# Patient Record
Sex: Female | Born: 1970
Health system: Southern US, Community
[De-identification: ages and names within clinical notes are randomized; demographics above are authoritative.]

## PROBLEM LIST (undated history)

## (undated) DIAGNOSIS — M1990 Unspecified osteoarthritis, unspecified site: Secondary | ICD-10-CM

## (undated) DIAGNOSIS — R112 Nausea with vomiting, unspecified: Secondary | ICD-10-CM

## (undated) DIAGNOSIS — T8859XA Other complications of anesthesia, initial encounter: Secondary | ICD-10-CM

## (undated) DIAGNOSIS — Z9889 Other specified postprocedural states: Secondary | ICD-10-CM

## (undated) DIAGNOSIS — K5100 Ulcerative (chronic) pancolitis without complications: Secondary | ICD-10-CM

## (undated) DIAGNOSIS — T4145XA Adverse effect of unspecified anesthetic, initial encounter: Secondary | ICD-10-CM

## (undated) DIAGNOSIS — I10 Essential (primary) hypertension: Secondary | ICD-10-CM

## (undated) DIAGNOSIS — K219 Gastro-esophageal reflux disease without esophagitis: Secondary | ICD-10-CM

## (undated) HISTORY — PX: CERVICAL FUSION: SHX112

## (undated) HISTORY — DX: Unspecified osteoarthritis, unspecified site: M19.90

## (undated) HISTORY — DX: Essential (primary) hypertension: I10

## (undated) HISTORY — PX: COLONOSCOPY: SHX174

## (undated) HISTORY — DX: Ulcerative (chronic) pancolitis without complications: K51.00

---
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
## 1998-01-20 ENCOUNTER — Encounter: Admission: RE | Admit: 1998-01-20 | Discharge: 1998-01-20 | Payer: Self-pay | Admitting: *Deleted

## 2000-06-03 ENCOUNTER — Other Ambulatory Visit: Admission: RE | Admit: 2000-06-03 | Discharge: 2000-06-03 | Payer: Self-pay | Admitting: Gastroenterology

## 2004-06-14 ENCOUNTER — Ambulatory Visit (HOSPITAL_COMMUNITY): Admission: RE | Admit: 2004-06-14 | Discharge: 2004-06-14 | Payer: Self-pay | Admitting: Gastroenterology

## 2005-03-21 ENCOUNTER — Other Ambulatory Visit: Admission: RE | Admit: 2005-03-21 | Discharge: 2005-03-21 | Payer: Self-pay | Admitting: Obstetrics & Gynecology

## 2011-09-19 ENCOUNTER — Other Ambulatory Visit: Payer: Self-pay | Admitting: Family Medicine

## 2011-09-19 DIAGNOSIS — Z1231 Encounter for screening mammogram for malignant neoplasm of breast: Secondary | ICD-10-CM

## 2011-10-01 ENCOUNTER — Ambulatory Visit
Admission: RE | Admit: 2011-10-01 | Discharge: 2011-10-01 | Disposition: A | Discharge status: Home / Self Care | Payer: BC Managed Care – PPO | Source: Ambulatory Visit | Attending: Family Medicine | Admitting: Family Medicine

## 2011-10-01 DIAGNOSIS — Z1231 Encounter for screening mammogram for malignant neoplasm of breast: Secondary | ICD-10-CM

## 2012-10-03 ENCOUNTER — Other Ambulatory Visit: Payer: Self-pay | Admitting: Gastroenterology

## 2012-10-06 ENCOUNTER — Ambulatory Visit
Admission: RE | Admit: 2012-10-06 | Discharge: 2012-10-06 | Disposition: A | Discharge status: Home / Self Care | Payer: BC Managed Care – PPO | Source: Ambulatory Visit | Attending: Gastroenterology | Admitting: Gastroenterology

## 2012-10-06 ENCOUNTER — Other Ambulatory Visit: Payer: Self-pay | Admitting: Gastroenterology

## 2012-10-06 DIAGNOSIS — K5150 Left sided colitis without complications: Secondary | ICD-10-CM

## 2013-07-14 DIAGNOSIS — K5100 Ulcerative (chronic) pancolitis without complications: Secondary | ICD-10-CM

## 2013-07-14 HISTORY — DX: Ulcerative (chronic) pancolitis without complications: K51.00

## 2014-05-14 ENCOUNTER — Emergency Department (HOSPITAL_COMMUNITY)
Admission: EM | Admit: 2014-05-14 | Discharge: 2014-05-14 | Discharge status: Against Medical Advice | Payer: BC Managed Care – PPO | Source: Home / Self Care

## 2014-05-14 ENCOUNTER — Encounter (HOSPITAL_COMMUNITY): Payer: Self-pay | Admitting: Emergency Medicine

## 2014-05-14 DIAGNOSIS — R0600 Dyspnea, unspecified: Secondary | ICD-10-CM

## 2014-05-14 DIAGNOSIS — R079 Chest pain, unspecified: Secondary | ICD-10-CM

## 2014-05-14 MED ORDER — ASPIRIN 81 MG PO CHEW
324.0000 mg | CHEWABLE_TABLET | Freq: Once | ORAL | Status: AC
  Administered 2014-05-14: 324 mg via ORAL

## 2014-05-14 MED ORDER — ASPIRIN 81 MG PO CHEW
CHEWABLE_TABLET | ORAL | Status: AC
  Filled 2014-05-14: qty 1

## 2014-05-14 MED ORDER — NITROGLYCERIN 0.4 MG SL SUBL
0.4000 mg | SUBLINGUAL_TABLET | SUBLINGUAL | Status: DC | PRN
  Administered 2014-05-14: 0.4 mg via SUBLINGUAL

## 2014-05-14 MED ORDER — SODIUM CHLORIDE 0.9 % IV SOLN: Freq: Once | INTRAVENOUS | Status: DC

## 2014-05-14 MED ORDER — NITROGLYCERIN 0.4 MG SL SUBL
SUBLINGUAL_TABLET | SUBLINGUAL | Status: AC
  Filled 2014-05-14: qty 1

## 2014-05-14 NOTE — ED Notes (Signed)
Patient c/o back pain radiating around to her rib cage onset 5 am. Patient reports she also has had high blood pressure readings this morning. Ranging in the 150s/109. Patient states she was previously on hypertensive medications. Due to exercise she was able to stop those medications. Patient denies cardiac hx but does have ulcerative colitis which she takes medication for. NAD. No SOB.

## 2014-05-14 NOTE — ED Provider Notes (Signed)
Medical screening examination/treatment/procedure(s) were performed by resident physician or non-physician practitioner and as supervising physician I was immediately available for consultation/collaboration.   Adriana Quinby DOUGLAS MD.   Tannis Burstein D Shakeel Disney, MD 05/14/14 1533 

## 2014-05-14 NOTE — ED Notes (Signed)
Patient refused IV and transfer to hospital. Explained to patient that risk include blood clot in the lung, heart attack and DEATH. Patient understands this and will schedule f/u with a cardiologist and see gastroenterologist as well.

## 2014-05-14 NOTE — ED Provider Notes (Signed)
CSN: [id number]     Arrival date & time 05/14/14  0825 History   First MD Initiated Contact with Patient 05/14/14 0845     Chief Complaint  Patient presents with  . Chest Pain   (Consider location/radiation/quality/duration/timing/severity/associated sxs/prior Treatment) HPI Comments: 43 year old female awoke this morning at 5 AM due to chest pain. She describes a squeezing around the posterior and bilateral aspect of her chest. She felt as though someone were grabbing her. She says there is also a dull ache in the anterior left chest and numbness in the left arm. 4. Sometime she had difficulty in breathing and "hard to catch my breath". She is continuing to have some chest symptoms. Although she had nausea for a few minutes this has abated. She denies known cardiovascular or pulmonary disease. She was a smoker in her 1720s. She walks 3 miles a day. Denies exertional symptoms.   History reviewed. No pertinent past medical history. History reviewed. No pertinent past surgical history. No family history on file. History  Substance Use Topics  . Smoking status: Never Smoker   . Smokeless tobacco: Never Used  . Alcohol Use: Yes   OB History   Grav Para Term Preterm Abortions TAB SAB Ect Mult Living                 Review of Systems  Constitutional: Positive for activity change. Negative for fever, diaphoresis, appetite change and fatigue.  HENT: Negative.   Respiratory: Positive for chest tightness and shortness of breath. Negative for cough, choking and wheezing.   Cardiovascular: Positive for chest pain. Negative for palpitations and leg swelling.  Gastrointestinal: Positive for nausea. Negative for vomiting and abdominal pain.  Genitourinary: Negative.   Skin: Negative.   Neurological: Negative.     Allergies  Review of patient's allergies indicates no known allergies.  Home Medications   Prior to Admission medications   Not on File   BP 132/64  Pulse 78  Temp(Src) 98.4  F (36.9 C) (Oral)  Resp 16  SpO2 97%  LMP 04/25/2014 Physical Exam  Nursing note and vitals reviewed. Constitutional: She is oriented to person, place, and time. She appears well-developed. No distress.  Eyes: Conjunctivae and EOM are normal. Pupils are equal, round, and reactive to light.  Neck: Normal range of motion. Neck supple.  Cardiovascular: Normal rate, regular rhythm, normal heart sounds and intact distal pulses.   No murmur heard. Pulmonary/Chest: Effort normal and breath sounds normal. No respiratory distress. She has no wheezes. She has no rales. She exhibits no tenderness.  Unable to reproduce chest or back pain with deep palpation.  Abdominal: Soft.  Mild tenderness to the epigastrium  Musculoskeletal: Normal range of motion. She exhibits no edema and no tenderness.  Neurological: She is alert and oriented to person, place, and time. She exhibits normal muscle tone.  Skin: Skin is warm and dry. No rash noted. She is not diaphoretic. No erythema.  Psychiatric: She has a normal mood and affect.    ED Course  Procedures (including critical care time) Labs Review Labs Reviewed - No data to display  Imaging Review No results found.   MDM   1. Chest pain at rest   2. Dyspnea     Transfer to ED chest pain center for evaluation of chest pain and dyspnea occurring abruptly early this morning.  Iv NS at 50 mils per hour  Oxygen at 2 L per nasal cannula Monitor Nitroglycerin 0.4 mg sublingual now Aspirin 324 mg  by mouth chew now  At the time of administration of oxygen and IV the patient decided to refuse transport to the emergency department. It was discussed in detail with her the possibility of having a pulmonary embolism or other blood clot, heart attack or cardiac ischemia or other unknown potentially lethal condition. She states she understands. She is also instructed to go to the emergency department for continuation of problems or worsening. Otherwise  follow-up with the PCP. She will sign an AMA.      Hayden Rasmussenavid Veneta Sliter, NP 05/14/14 [phone]

## 2014-05-18 ENCOUNTER — Other Ambulatory Visit: Payer: Self-pay | Admitting: Gastroenterology

## 2014-05-18 DIAGNOSIS — R1084 Generalized abdominal pain: Secondary | ICD-10-CM

## 2014-06-02 ENCOUNTER — Ambulatory Visit (HOSPITAL_COMMUNITY)
Admission: RE | Admit: 2014-06-02 | Discharge: 2014-06-02 | Disposition: A | Discharge status: Home / Self Care | Payer: BC Managed Care – PPO | Source: Ambulatory Visit | Attending: Gastroenterology | Admitting: Gastroenterology

## 2014-06-02 ENCOUNTER — Other Ambulatory Visit: Payer: Self-pay | Admitting: Gastroenterology

## 2014-06-02 ENCOUNTER — Encounter (HOSPITAL_COMMUNITY)
Admission: RE | Admit: 2014-06-02 | Discharge: 2014-06-02 | Disposition: A | Discharge status: Home / Self Care | Payer: BC Managed Care – PPO | Source: Ambulatory Visit | Attending: Gastroenterology | Admitting: Gastroenterology

## 2014-06-02 DIAGNOSIS — R1084 Generalized abdominal pain: Secondary | ICD-10-CM

## 2014-06-02 DIAGNOSIS — R109 Unspecified abdominal pain: Secondary | ICD-10-CM | POA: Insufficient documentation

## 2014-06-02 MED ORDER — TECHNETIUM TC 99M MEBROFENIN IV KIT
5.1000 | PACK | Freq: Once | INTRAVENOUS | Status: AC | PRN
  Administered 2014-06-02: 5 via INTRAVENOUS

## 2014-06-02 MED ORDER — SINCALIDE 5 MCG IJ SOLR
0.0200 ug/kg | Freq: Once | INTRAMUSCULAR | Status: AC
  Administered 2014-06-02: 1.8 ug via INTRAVENOUS

## 2015-12-05 DIAGNOSIS — K51911 Ulcerative colitis, unspecified with rectal bleeding: Secondary | ICD-10-CM | POA: Diagnosis not present

## 2015-12-05 DIAGNOSIS — E669 Obesity, unspecified: Secondary | ICD-10-CM | POA: Diagnosis not present

## 2015-12-05 DIAGNOSIS — M2550 Pain in unspecified joint: Secondary | ICD-10-CM | POA: Diagnosis not present

## 2015-12-06 DIAGNOSIS — K51911 Ulcerative colitis, unspecified with rectal bleeding: Secondary | ICD-10-CM | POA: Diagnosis not present

## 2015-12-06 DIAGNOSIS — E669 Obesity, unspecified: Secondary | ICD-10-CM | POA: Diagnosis not present

## 2015-12-06 DIAGNOSIS — M2550 Pain in unspecified joint: Secondary | ICD-10-CM | POA: Diagnosis not present

## 2016-01-13 ENCOUNTER — Other Ambulatory Visit: Payer: Self-pay | Admitting: Family Medicine

## 2016-01-13 DIAGNOSIS — Z0000 Encounter for general adult medical examination without abnormal findings: Secondary | ICD-10-CM | POA: Diagnosis not present

## 2016-01-13 DIAGNOSIS — K5100 Ulcerative (chronic) pancolitis without complications: Secondary | ICD-10-CM | POA: Diagnosis not present

## 2016-01-13 DIAGNOSIS — Z1231 Encounter for screening mammogram for malignant neoplasm of breast: Secondary | ICD-10-CM

## 2016-01-13 DIAGNOSIS — Z1151 Encounter for screening for human papillomavirus (HPV): Secondary | ICD-10-CM | POA: Diagnosis not present

## 2016-01-13 DIAGNOSIS — I10 Essential (primary) hypertension: Secondary | ICD-10-CM | POA: Diagnosis not present

## 2016-01-13 DIAGNOSIS — Z124 Encounter for screening for malignant neoplasm of cervix: Secondary | ICD-10-CM | POA: Diagnosis not present

## 2016-01-13 LAB — HM PAP SMEAR: HM Pap smear: NEGATIVE

## 2016-01-24 ENCOUNTER — Other Ambulatory Visit: Payer: Self-pay | Admitting: Family Medicine

## 2016-01-24 ENCOUNTER — Ambulatory Visit
Admission: RE | Admit: 2016-01-24 | Discharge: 2016-01-24 | Disposition: A | Discharge status: Home / Self Care | Payer: BLUE CROSS/BLUE SHIELD | Source: Ambulatory Visit | Attending: Family Medicine | Admitting: Family Medicine

## 2016-01-24 DIAGNOSIS — Z1231 Encounter for screening mammogram for malignant neoplasm of breast: Secondary | ICD-10-CM

## 2016-01-26 ENCOUNTER — Other Ambulatory Visit: Payer: Self-pay | Admitting: Family Medicine

## 2016-01-26 DIAGNOSIS — R928 Other abnormal and inconclusive findings on diagnostic imaging of breast: Secondary | ICD-10-CM

## 2016-01-31 ENCOUNTER — Ambulatory Visit
Admission: RE | Admit: 2016-01-31 | Discharge: 2016-01-31 | Disposition: A | Discharge status: Home / Self Care | Payer: BLUE CROSS/BLUE SHIELD | Source: Ambulatory Visit | Attending: Family Medicine | Admitting: Family Medicine

## 2016-01-31 DIAGNOSIS — A039 Shigellosis, unspecified: Secondary | ICD-10-CM | POA: Diagnosis not present

## 2016-01-31 DIAGNOSIS — R195 Other fecal abnormalities: Secondary | ICD-10-CM | POA: Diagnosis not present

## 2016-01-31 DIAGNOSIS — N6489 Other specified disorders of breast: Secondary | ICD-10-CM | POA: Diagnosis not present

## 2016-01-31 DIAGNOSIS — R194 Change in bowel habit: Secondary | ICD-10-CM | POA: Diagnosis not present

## 2016-01-31 DIAGNOSIS — R928 Other abnormal and inconclusive findings on diagnostic imaging of breast: Secondary | ICD-10-CM | POA: Diagnosis not present

## 2016-01-31 DIAGNOSIS — K625 Hemorrhage of anus and rectum: Secondary | ICD-10-CM | POA: Diagnosis not present

## 2016-01-31 DIAGNOSIS — R109 Unspecified abdominal pain: Secondary | ICD-10-CM | POA: Diagnosis not present

## 2016-01-31 DIAGNOSIS — Z0000 Encounter for general adult medical examination without abnormal findings: Secondary | ICD-10-CM | POA: Diagnosis not present

## 2016-02-01 ENCOUNTER — Other Ambulatory Visit: Payer: BLUE CROSS/BLUE SHIELD

## 2016-03-02 DIAGNOSIS — I10 Essential (primary) hypertension: Secondary | ICD-10-CM | POA: Diagnosis not present

## 2016-03-02 DIAGNOSIS — R7989 Other specified abnormal findings of blood chemistry: Secondary | ICD-10-CM | POA: Diagnosis not present

## 2016-03-02 DIAGNOSIS — R3129 Other microscopic hematuria: Secondary | ICD-10-CM | POA: Diagnosis not present

## 2016-06-26 DIAGNOSIS — Z23 Encounter for immunization: Secondary | ICD-10-CM | POA: Diagnosis not present

## 2016-07-25 DIAGNOSIS — I10 Essential (primary) hypertension: Secondary | ICD-10-CM | POA: Diagnosis not present

## 2016-07-25 DIAGNOSIS — J40 Bronchitis, not specified as acute or chronic: Secondary | ICD-10-CM | POA: Diagnosis not present

## 2016-07-25 DIAGNOSIS — R05 Cough: Secondary | ICD-10-CM | POA: Diagnosis not present

## 2016-07-25 DIAGNOSIS — J0100 Acute maxillary sinusitis, unspecified: Secondary | ICD-10-CM | POA: Diagnosis not present

## 2017-03-11 ENCOUNTER — Other Ambulatory Visit: Payer: Self-pay | Admitting: Gastroenterology

## 2017-03-11 DIAGNOSIS — Z1231 Encounter for screening mammogram for malignant neoplasm of breast: Secondary | ICD-10-CM

## 2017-03-25 ENCOUNTER — Ambulatory Visit: Payer: BLUE CROSS/BLUE SHIELD

## 2017-03-28 ENCOUNTER — Ambulatory Visit
Admission: RE | Admit: 2017-03-28 | Discharge: 2017-03-28 | Disposition: A | Discharge status: Home / Self Care | Payer: BLUE CROSS/BLUE SHIELD | Source: Ambulatory Visit | Attending: Gastroenterology | Admitting: Gastroenterology

## 2017-03-28 DIAGNOSIS — Z1231 Encounter for screening mammogram for malignant neoplasm of breast: Secondary | ICD-10-CM | POA: Diagnosis not present

## 2017-05-28 DIAGNOSIS — Z23 Encounter for immunization: Secondary | ICD-10-CM | POA: Diagnosis not present

## 2017-06-15 DIAGNOSIS — J019 Acute sinusitis, unspecified: Secondary | ICD-10-CM | POA: Diagnosis not present

## 2017-06-15 DIAGNOSIS — I10 Essential (primary) hypertension: Secondary | ICD-10-CM | POA: Diagnosis not present

## 2017-06-15 DIAGNOSIS — B9689 Other specified bacterial agents as the cause of diseases classified elsewhere: Secondary | ICD-10-CM | POA: Diagnosis not present

## 2017-06-15 DIAGNOSIS — H109 Unspecified conjunctivitis: Secondary | ICD-10-CM | POA: Diagnosis not present

## 2017-06-15 DIAGNOSIS — J209 Acute bronchitis, unspecified: Secondary | ICD-10-CM | POA: Diagnosis not present

## 2017-06-28 ENCOUNTER — Ambulatory Visit: Payer: BLUE CROSS/BLUE SHIELD | Admitting: Family Medicine

## 2017-07-23 ENCOUNTER — Encounter: Payer: Self-pay | Admitting: Family Medicine

## 2017-07-23 ENCOUNTER — Encounter: Payer: Self-pay | Admitting: *Deleted

## 2017-07-26 ENCOUNTER — Ambulatory Visit: Payer: BLUE CROSS/BLUE SHIELD | Admitting: Family Medicine

## 2017-08-16 ENCOUNTER — Ambulatory Visit: Payer: BLUE CROSS/BLUE SHIELD | Admitting: Family Medicine

## 2017-08-20 DIAGNOSIS — M542 Cervicalgia: Secondary | ICD-10-CM | POA: Diagnosis not present

## 2017-08-20 DIAGNOSIS — R202 Paresthesia of skin: Secondary | ICD-10-CM | POA: Diagnosis not present

## 2017-08-22 ENCOUNTER — Other Ambulatory Visit: Payer: Self-pay | Admitting: Neurosurgery

## 2017-08-22 DIAGNOSIS — R202 Paresthesia of skin: Secondary | ICD-10-CM

## 2017-08-27 ENCOUNTER — Ambulatory Visit
Admission: RE | Admit: 2017-08-27 | Discharge: 2017-08-27 | Disposition: A | Discharge status: Home / Self Care | Payer: BLUE CROSS/BLUE SHIELD | Source: Ambulatory Visit | Attending: Neurosurgery | Admitting: Neurosurgery

## 2017-08-27 DIAGNOSIS — M4802 Spinal stenosis, cervical region: Secondary | ICD-10-CM | POA: Diagnosis not present

## 2017-08-27 DIAGNOSIS — R202 Paresthesia of skin: Secondary | ICD-10-CM

## 2017-08-30 DIAGNOSIS — Z6833 Body mass index (BMI) 33.0-33.9, adult: Secondary | ICD-10-CM | POA: Diagnosis not present

## 2017-08-30 DIAGNOSIS — M4802 Spinal stenosis, cervical region: Secondary | ICD-10-CM | POA: Diagnosis not present

## 2017-08-30 DIAGNOSIS — I10 Essential (primary) hypertension: Secondary | ICD-10-CM | POA: Diagnosis not present

## 2017-09-06 ENCOUNTER — Ambulatory Visit: Payer: BLUE CROSS/BLUE SHIELD | Admitting: Family Medicine

## 2017-09-06 ENCOUNTER — Other Ambulatory Visit: Payer: Self-pay

## 2017-09-06 ENCOUNTER — Encounter: Payer: Self-pay | Admitting: Family Medicine

## 2017-09-06 VITALS — BP 152/110 | HR 79 | Temp 99.7°F | Wt 207.2 lb

## 2017-09-06 DIAGNOSIS — M50122 Cervical disc disorder at C5-C6 level with radiculopathy: Secondary | ICD-10-CM | POA: Diagnosis not present

## 2017-09-06 DIAGNOSIS — I10 Essential (primary) hypertension: Secondary | ICD-10-CM

## 2017-09-06 DIAGNOSIS — R9431 Abnormal electrocardiogram [ECG] [EKG]: Secondary | ICD-10-CM | POA: Diagnosis not present

## 2017-09-06 MED ORDER — LISINOPRIL 10 MG PO TABS: 10.0000 mg | ORAL_TABLET | Freq: Every day | ORAL | 3 refills | Status: DC

## 2017-09-06 NOTE — Progress Notes (Signed)
Subjective  CC:  Chief Complaint  Patient presents with  . Establish Care    Transfer from [place]     HPI: Jillian Lopez is a 47 y.o. female who presents to the office today to address the problems listed above in the chief complaint. She is a former [facility] patient and is here to reestablish care with me today.    Hypertension f/u: Control is poor since being off meds x 2 weeks. she ran out. bp was very high at recent preop visit.. Prior to running out, she reports bp was good. Pt reports she is doing well. no medication side effects noted, no TIAs, no chest pain on exertion, no dyspnea on exertion, no swelling of ankles. She denies adverse effects from his BP medications. Compliance with medication was good before running out. She has been on prednisone due to neck radiculopathy. BP initially 152/120 today.   Cervical radiculopathy for lumbar fusion and discectomy next week: awoke 2 weeks ago with numbness in legs and arms. No weakness or neck pain. Evaluated by Dr. Wynetta Emery and MRI revealed C5-6 compressed disc with spurring impinging spinal cord. Treated with steroid and having surgery next week.   UC - doing well off meds now.   HM: last cpe in 2017. Pap up to date. mammo up to date. Due for labs.   BP Readings from Last 3 Encounters:  09/06/17 (!) 152/110  05/14/14 132/64   Wt Readings from Last 3 Encounters:  09/06/17 207 lb 3.2 oz (94 kg)  06/02/14 202 lb (91.6 kg)    I reviewed the patients updated PMH, FH, and SocHx.    Patient Active Problem List   Diagnosis Date Noted  . Cervical disc disorder at C5-C6 level with radiculopathy 09/06/2017  . Essential hypertension 03/02/2016  . Ulcerative pancolitis without complication (HCC) 07/14/2013    Allergies: Patient has no known allergies.  Social History: Patient  reports that she quit smoking about 19 years ago. Her smoking use included cigarettes. She has a 1.50 pack-year smoking history. she has never used smokeless  tobacco. She reports that she drinks alcohol. She reports that she does not use drugs.  Current Meds  Medication Sig  . methylPREDNISolone (MEDROL DOSEPAK) 4 MG TBPK tablet See admin instructions. see package    Review of Systems: Cardiovascular: negative for chest pain, palpitations, leg swelling, orthopnea Respiratory: negative for SOB, wheezing or persistent cough Gastrointestinal: negative for abdominal pain Genitourinary: negative for dysuria or gross hematuria Neuro: + numbness in bilateral upper extremities  Objective  Vitals: BP (!) 152/110 Comment: recheked in supine position by A. Peterman  Pulse 79   Temp 99.7 F (37.6 C)   Wt 207 lb 3.2 oz (94 kg)  General: no acute distress  Psych:  Alert and oriented, normal mood and affect HEENT:  Normocephalic, atraumatic, supple neck  Cardiovascular:  RRR without murmur. no edema Respiratory:  Good breath sounds bilaterally, CTAB with normal respiratory effort Skin:  Warm, no rashes Neurologic:   Mental status is normal EKG: compared to 2015; NSR, rate 65, Q waves in V1and V2 Assessment  1. Essential hypertension   2. Cervical disc disorder at C5-C6 level with radiculopathy   3. Abnormal EKG      Plan    Hypertension f/u: BP control is poorly controlled. Due to running out of medication. Restart ace inhibitor and recheck bps in office over next week prior to surgery. Adjust meds quickly to get bp controlled prior to surgery. Check  labs today.  Cervical radiculopathy f/u: stable w/o weakness. Needs surgery. Needs clearance first. Can't proceed with uncontrolled bp and need to clarify CVD risks.   Abnormal EKG: possible old anteroseptal infarct. ekg changes are new. No acute changes. May warrant stress testing/echocardiogram.  Education regarding management of these chronic disease states was given. Management strategies discussed on successive visits include dietary and exercise recommendations, goals of achieving and  maintaining IBW, and lifestyle modifications aiming for adequate sleep and minimizing stressors.   Follow up: f/u early next week to recheck blood pressure.   Commons side effects, risks, benefits, and alternatives for medications and treatment plan prescribed today were discussed, and the patient expressed understanding of the given instructions. Patient is instructed to call or message via MyChart if he/she has any questions or concerns regarding our treatment plan. No barriers to understanding were identified. We discussed Red Flag symptoms and signs in detail. Patient expressed understanding regarding what to do in case of urgent or emergency type symptoms.   Medication list was reconciled, printed and provided to the patient in AVS. Patient instructions and summary information was reviewed with the patient as documented in the AVS. This note was prepared with assistance of Dragon voice recognition software. Occasional wrong-word or sound-a-like substitutions may have occurred due to the inherent limitations of voice recognition software  Orders Placed This Encounter  Procedures  . CBC with Differential/Platelet  . Comprehensive metabolic panel  . TSH  . Lipid panel  . EKG 12-Lead   Meds ordered this encounter  Medications  . lisinopril (PRINIVIL,ZESTRIL) 10 MG tablet    Sig: Take 1 tablet (10 mg total) by mouth daily.    Dispense:  90 tablet    Refill:  3

## 2017-09-06 NOTE — Patient Instructions (Signed)
Please return in 3 months for blood pressure follow up.   It was a pleasure meeting you today! Thank you for choosing [place] to meet your healthcare needs! I truly look forward to working with you. If you have any questions or concerns, please send me a message via Mychart or call the office at [phone].  Hypertension Hypertension, commonly called high blood pressure, is when the force of blood pumping through the arteries is too strong. The arteries are the blood vessels that carry blood from the heart throughout the body. Hypertension forces the heart to work harder to pump blood and may cause arteries to become narrow or stiff. Having untreated or uncontrolled hypertension can cause heart attacks, strokes, kidney disease, and other problems. A blood pressure reading consists of a higher number over a lower number. Ideally, your blood pressure should be below 120/80. The first ("top") number is called the systolic pressure. It is a measure of the pressure in your arteries as your heart beats. The second ("bottom") number is called the diastolic pressure. It is a measure of the pressure in your arteries as the heart relaxes. What are the causes? The cause of this condition is not known. What increases the risk? Some risk factors for high blood pressure are under your control. Others are not. Factors you can change  Smoking.  Having type 2 diabetes mellitus, high cholesterol, or both.  Not getting enough exercise or physical activity.  Being overweight.  Having too much fat, sugar, calories, or salt (sodium) in your diet.  Drinking too much alcohol. Factors that are difficult or impossible to change  Having chronic kidney disease.  Having a family history of high blood pressure.  Age. Risk increases with age.  Race. You may be at higher risk if you are African-American.  Gender. Men are at higher risk than women before age 20. After age 22, women are at higher risk than  men.  Having obstructive sleep apnea.  Stress. What are the signs or symptoms? Extremely high blood pressure (hypertensive crisis) may cause:  Headache.  Anxiety.  Shortness of breath.  Nosebleed.  Nausea and vomiting.  Severe chest pain.  Jerky movements you cannot control (seizures).  How is this diagnosed? This condition is diagnosed by measuring your blood pressure while you are seated, with your arm resting on a surface. The cuff of the blood pressure monitor will be placed directly against the skin of your upper arm at the level of your heart. It should be measured at least twice using the same arm. Certain conditions can cause a difference in blood pressure between your right and left arms. Certain factors can cause blood pressure readings to be lower or higher than normal (elevated) for a short period of time:  When your blood pressure is higher when you are in a health care provider's office than when you are at home, this is called white coat hypertension. Most people with this condition do not need medicines.  When your blood pressure is higher at home than when you are in a health care provider's office, this is called masked hypertension. Most people with this condition may need medicines to control blood pressure.  If you have a high blood pressure reading during one visit or you have normal blood pressure with other risk factors:  You may be asked to return on a different day to have your blood pressure checked again.  You may be asked to monitor your blood pressure at home for  1 week or longer.  If you are diagnosed with hypertension, you may have other blood or imaging tests to help your health care provider understand your overall risk for other conditions. How is this treated? This condition is treated by making healthy lifestyle changes, such as eating healthy foods, exercising more, and reducing your alcohol intake. Your health care provider may prescribe  medicine if lifestyle changes are not enough to get your blood pressure under control, and if:  Your systolic blood pressure is above 130.  Your diastolic blood pressure is above 80.  Your personal target blood pressure may vary depending on your medical conditions, your age, and other factors. Follow these instructions at home: Eating and drinking  Eat a diet that is high in fiber and potassium, and low in sodium, added sugar, and fat. An example eating plan is called the DASH (Dietary Approaches to Stop Hypertension) diet. To eat this way: ? Eat plenty of fresh fruits and vegetables. Try to fill half of your plate at each meal with fruits and vegetables. ? Eat whole grains, such as whole wheat pasta, brown rice, or whole grain bread. Fill about one quarter of your plate with whole grains. ? Eat or drink low-fat dairy products, such as skim milk or low-fat yogurt. ? Avoid fatty cuts of meat, processed or cured meats, and poultry with skin. Fill about one quarter of your plate with lean proteins, such as fish, chicken without skin, beans, eggs, and tofu. ? Avoid premade and processed foods. These tend to be higher in sodium, added sugar, and fat.  Reduce your daily sodium intake. Most people with hypertension should eat less than 1,500 mg of sodium a day.  Limit alcohol intake to no more than 1 drink a day for nonpregnant women and 2 drinks a day for men. One drink equals 12 oz of beer, 5 oz of wine, or 1 oz of hard liquor. Lifestyle  Work with your health care provider to maintain a healthy body weight or to lose weight. Ask what an ideal weight is for you.  Get at least 30 minutes of exercise that causes your heart to beat faster (aerobic exercise) most days of the week. Activities may include walking, swimming, or biking.  Include exercise to strengthen your muscles (resistance exercise), such as pilates or lifting weights, as part of your weekly exercise routine. Try to do these types  of exercises for 30 minutes at least 3 days a week.  Do not use any products that contain nicotine or tobacco, such as cigarettes and e-cigarettes. If you need help quitting, ask your health care provider.  Monitor your blood pressure at home as told by your health care provider.  Keep all follow-up visits as told by your health care provider. This is important. Medicines  Take over-the-counter and prescription medicines only as told by your health care provider. Follow directions carefully. Blood pressure medicines must be taken as prescribed.  Do not skip doses of blood pressure medicine. Doing this puts you at risk for problems and can make the medicine less effective.  Ask your health care provider about side effects or reactions to medicines that you should watch for. Contact a health care provider if:  You think you are having a reaction to a medicine you are taking.  You have headaches that keep coming back (recurring).  You feel dizzy.  You have swelling in your ankles.  You have trouble with your vision. Get help right away if:  You develop a severe headache or confusion.  You have unusual weakness or numbness.  You feel faint.  You have severe pain in your chest or abdomen.  You vomit repeatedly.  You have trouble breathing. Summary  Hypertension is when the force of blood pumping through your arteries is too strong. If this condition is not controlled, it may put you at risk for serious complications.  Your personal target blood pressure may vary depending on your medical conditions, your age, and other factors. For most people, a normal blood pressure is less than 120/80.  Hypertension is treated with lifestyle changes, medicines, or a combination of both. Lifestyle changes include weight loss, eating a healthy, low-sodium diet, exercising more, and limiting alcohol. This information is not intended to replace advice given to you by your health care provider.  Make sure you discuss any questions you have with your health care provider. Document Released: 07/02/2005 Document Revised: 05/30/2016 Document Reviewed: 05/30/2016 Elsevier Interactive Patient Education  [employer].

## 2017-09-09 ENCOUNTER — Telehealth: Payer: Self-pay | Admitting: Family Medicine

## 2017-09-09 ENCOUNTER — Encounter: Payer: Self-pay | Admitting: Family Medicine

## 2017-09-09 NOTE — Telephone Encounter (Signed)
Spoke with patient, will repeat EKG and labs at next visit. Patient verbalized understanding. AP, LPN

## 2017-09-09 NOTE — Telephone Encounter (Signed)
EKG - I believe it is ok but needs to be repeated at next visit. No further evaluation needed prior to surgery. Will need labwork and repeat ekg with me at her next visit. Blood pressure is not controlled. Continue meds.

## 2017-09-09 NOTE — Telephone Encounter (Signed)
Patient is going to [facility] for other labs, wants to know can she do labs at other visit. Please advise

## 2017-09-09 NOTE — Telephone Encounter (Signed)
Copied from [place]. Topic: Quick Communication - See Telephone Encounter >> Sep 09, 2017  9:00 AM Robina Ade, Helene Kelp D wrote: CRM for notification. See Telephone encounter for: 09/09/17. Patient called and would like a call back regarding her labs and EKG she had done. She wants to know if everything is ok for her surgery. Please call patient back, thanks.

## 2017-09-10 DIAGNOSIS — M4802 Spinal stenosis, cervical region: Secondary | ICD-10-CM | POA: Diagnosis not present

## 2017-09-10 DIAGNOSIS — Z01812 Encounter for preprocedural laboratory examination: Secondary | ICD-10-CM | POA: Diagnosis not present

## 2017-09-11 DIAGNOSIS — M4802 Spinal stenosis, cervical region: Secondary | ICD-10-CM | POA: Diagnosis not present

## 2017-09-13 DIAGNOSIS — M4712 Other spondylosis with myelopathy, cervical region: Secondary | ICD-10-CM | POA: Diagnosis not present

## 2017-09-13 DIAGNOSIS — M50022 Cervical disc disorder at C5-C6 level with myelopathy: Secondary | ICD-10-CM | POA: Diagnosis not present

## 2017-09-13 DIAGNOSIS — M5020 Other cervical disc displacement, unspecified cervical region: Secondary | ICD-10-CM | POA: Diagnosis not present

## 2017-10-17 DIAGNOSIS — M542 Cervicalgia: Secondary | ICD-10-CM | POA: Diagnosis not present

## 2017-11-21 DIAGNOSIS — M542 Cervicalgia: Secondary | ICD-10-CM | POA: Diagnosis not present

## 2017-12-06 ENCOUNTER — Ambulatory Visit: Payer: BLUE CROSS/BLUE SHIELD | Admitting: Family Medicine

## 2018-01-24 ENCOUNTER — Ambulatory Visit: Payer: BLUE CROSS/BLUE SHIELD | Admitting: Family Medicine

## 2018-02-27 ENCOUNTER — Telehealth: Payer: Self-pay | Admitting: Family Medicine

## 2018-02-27 MED ORDER — PREDNISONE 10 MG PO TABS: ORAL_TABLET | ORAL | 0 refills | Status: DC

## 2018-02-27 NOTE — Telephone Encounter (Signed)
Pt called: stress at work; having a uc flare with diarrhea and cramping. Requests pred taper.  Has GI doc and is scheduling an appointment.   Discussed overdue for BP f/u and cpe.  Pt reports will schedule an appt.   meds ordered.  Discussed red flag sxs. None present.

## 2018-03-20 DIAGNOSIS — Z1211 Encounter for screening for malignant neoplasm of colon: Secondary | ICD-10-CM | POA: Diagnosis not present

## 2018-03-20 DIAGNOSIS — K5190 Ulcerative colitis, unspecified, without complications: Secondary | ICD-10-CM | POA: Diagnosis not present

## 2018-03-20 DIAGNOSIS — K120 Recurrent oral aphthae: Secondary | ICD-10-CM | POA: Diagnosis not present

## 2018-04-11 ENCOUNTER — Ambulatory Visit (INDEPENDENT_AMBULATORY_CARE_PROVIDER_SITE_OTHER): Payer: BLUE CROSS/BLUE SHIELD | Admitting: Family Medicine

## 2018-04-11 ENCOUNTER — Other Ambulatory Visit: Payer: Self-pay

## 2018-04-11 ENCOUNTER — Encounter: Payer: Self-pay | Admitting: Family Medicine

## 2018-04-11 VITALS — BP 138/80 | HR 79 | Temp 98.7°F | Ht 67.0 in | Wt 202.6 lb

## 2018-04-11 DIAGNOSIS — Z23 Encounter for immunization: Secondary | ICD-10-CM | POA: Diagnosis not present

## 2018-04-11 DIAGNOSIS — R7989 Other specified abnormal findings of blood chemistry: Secondary | ICD-10-CM | POA: Diagnosis not present

## 2018-04-11 DIAGNOSIS — N951 Menopausal and female climacteric states: Secondary | ICD-10-CM | POA: Diagnosis not present

## 2018-04-11 DIAGNOSIS — I10 Essential (primary) hypertension: Secondary | ICD-10-CM | POA: Diagnosis not present

## 2018-04-11 DIAGNOSIS — H60543 Acute eczematoid otitis externa, bilateral: Secondary | ICD-10-CM | POA: Diagnosis not present

## 2018-04-11 LAB — CBC WITH DIFFERENTIAL/PLATELET
BASOS PCT: 0.5 % (ref 0.0–3.0)
Basophils Absolute: 0 10*3/uL (ref 0.0–0.1)
Eosinophils Absolute: 0.2 10*3/uL (ref 0.0–0.7)
Eosinophils Relative: 1.9 % (ref 0.0–5.0)
HCT: 42.1 % (ref 36.0–46.0)
HEMOGLOBIN: 14.3 g/dL (ref 12.0–15.0)
Lymphocytes Relative: 27.6 % (ref 12.0–46.0)
Lymphs Abs: 2.3 10*3/uL (ref 0.7–4.0)
MCHC: 33.9 g/dL (ref 30.0–36.0)
MCV: 95.3 fl (ref 78.0–100.0)
Monocytes Absolute: 0.4 10*3/uL (ref 0.1–1.0)
Monocytes Relative: 4.8 % (ref 3.0–12.0)
Neutro Abs: 5.5 10*3/uL (ref 1.4–7.7)
Neutrophils Relative %: 65.2 % (ref 43.0–77.0)
Platelets: 295 10*3/uL (ref 150.0–400.0)
RBC: 4.42 Mil/uL (ref 3.87–5.11)
RDW: 13.8 % (ref 11.5–15.5)
WBC: 8.4 10*3/uL (ref 4.0–10.5)

## 2018-04-11 LAB — COMPREHENSIVE METABOLIC PANEL
ALK PHOS: 42 U/L (ref 39–117)
ALT: 22 U/L (ref 0–35)
AST: 15 U/L (ref 0–37)
Albumin: 4.5 g/dL (ref 3.5–5.2)
BUN: 16 mg/dL (ref 6–23)
CALCIUM: 9.5 mg/dL (ref 8.4–10.5)
CHLORIDE: 104 meq/L (ref 96–112)
CO2: 24 mEq/L (ref 19–32)
CREATININE: 0.85 mg/dL (ref 0.40–1.20)
GFR: 75.59 mL/min (ref >60.00)
Glucose, Bld: 81 mg/dL (ref 70–99)
Potassium: 4.3 mEq/L (ref 3.5–5.1)
Sodium: 135 mEq/L (ref 135–145)
Total Bilirubin: 0.7 mg/dL (ref 0.2–1.2)
Total Protein: 7.5 g/dL (ref 6.0–8.3)

## 2018-04-11 LAB — LIPID PANEL
Cholesterol: 182 mg/dL (ref 0–200)
HDL: 37.4 mg/dL — ABNORMAL LOW (ref >39.00)
LDL Cholesterol: 115 mg/dL — ABNORMAL HIGH (ref 0–99)
NonHDL: 144.81
TRIGLYCERIDES: 147 mg/dL (ref 0.0–149.0)
Total CHOL/HDL Ratio: 5
VLDL: 29.4 mg/dL (ref 0.0–40.0)

## 2018-04-11 LAB — T4, FREE: Free T4: 0.84 ng/dL (ref 0.60–1.60)

## 2018-04-11 LAB — TSH: TSH: 2.65 u[IU]/mL (ref 0.35–4.50)

## 2018-04-11 MED ORDER — TRIAMCINOLONE ACETONIDE 0.1 % EX CREA: 1.0000 "application " | TOPICAL_CREAM | Freq: Two times a day (BID) | CUTANEOUS | 0 refills | Status: DC

## 2018-04-11 NOTE — Patient Instructions (Signed)
Please return in 6 months for your annual complete physical; please come fasting.    If you have any questions or concerns, please don't hesitate to send me a message via MyChart or call the office at [phone]. Thank you for visiting with [place] today! It's our pleasure caring for you.  Perimenopause Perimenopause is the time when your body begins to move into the menopause (no menstrual period for 12 straight months). It is a natural process. Perimenopause can begin 2-8 years before the menopause and usually lasts for 1 year after the menopause. During this time, your ovaries may or may not produce an egg. The ovaries vary in their production of estrogen and progesterone hormones each month. This can cause irregular menstrual periods, difficulty getting pregnant, vaginal bleeding between periods, and uncomfortable symptoms. What are the causes?  Irregular production of the ovarian hormones, estrogen and progesterone, and not ovulating every month. Other causes include:  Tumor of the pituitary gland in the brain.  Medical disease that affects the ovaries.  Radiation treatment.  Chemotherapy.  Unknown causes.  Heavy smoking and excessive alcohol intake can bring on perimenopause sooner.  What are the signs or symptoms?  Hot flashes.  Night sweats.  Irregular menstrual periods.  Decreased sex drive.  Vaginal dryness.  Headaches.  Mood swings.  Depression.  Memory problems.  Irritability.  Tiredness.  Weight gain.  Trouble getting pregnant.  The beginning of losing bone cells (osteoporosis).  The beginning of hardening of the arteries (atherosclerosis). How is this diagnosed? Your health care provider will make a diagnosis by analyzing your age, menstrual history, and symptoms. He or she will do a physical exam and note any changes in your body, especially your female organs. Female hormone tests may or may not be helpful depending on the amount of female  hormones you produce and when you produce them. However, other hormone tests may be helpful to rule out other problems. How is this treated? In some cases, no treatment is needed. The decision on whether treatment is necessary during the perimenopause should be made by you and your health care provider based on how the symptoms are affecting you and your lifestyle. Various treatments are available, such as:  Treating individual symptoms with a specific medicine for that symptom.  Herbal medicines that can help specific symptoms.  Counseling.  Group therapy.  Follow these instructions at home:  Keep track of your menstrual periods (when they occur, how heavy they are, how long between periods, and how long they last) as well as your symptoms and when they started.  Only take over-the-counter or prescription medicines as directed by your health care provider.  Sleep and rest.  Exercise.  Eat a diet that contains calcium (good for your bones) and soy (acts like the estrogen hormone).  Do not smoke.  Avoid alcoholic beverages.  Take vitamin supplements as recommended by your health care provider. Taking vitamin E may help in certain cases.  Take calcium and vitamin D supplements to help prevent bone loss.  Group therapy is sometimes helpful.  Acupuncture may help in some cases. Contact a health care provider if:  You have questions about any symptoms you are having.  You need a referral to a specialist (gynecologist, psychiatrist, or psychologist). Get help right away if:  You have vaginal bleeding.  Your period lasts longer than 8 days.  Your periods are recurring sooner than 21 days.  You have bleeding after intercourse.  You have severe depression.  You  have pain when you urinate.  You have severe headaches.  You have vision problems. This information is not intended to replace advice given to you by your health care provider. Make sure you discuss any  questions you have with your health care provider. Document Released: 08/09/2004 Document Revised: 12/08/2015 Document Reviewed: 01/29/2013 Elsevier Interactive Patient Education  2017 [place].  Calcium Intake Recommendations You can take Caltrate Plus twice a day or get it through your diet or other OTC supplements (Viactiv, OsCal etc)  Calcium is a mineral that affects many functions in the body, including:  Blood clotting.  Blood vessel function.  Nerve impulse conduction.  Hormone secretion.  Muscle contraction.  Bone and teeth functions.  Most of your body's calcium supply is stored in your bones and teeth. When your calcium stores are low, you may be at risk for low bone mass, bone loss, and bone fractures. Consuming enough calcium helps to grow healthy bones and teeth and to prevent breakdown over time. It is very important that you get enough calcium if you are:  A child undergoing rapid growth.  An adolescent girl.  A pre- or post-menopausal woman.  A woman whose menstrual cycle has stopped due to anorexia nervosa or regular intense exercise.  An individual with lactose intolerance or a milk allergy.  A vegetarian.  What is my plan? Try to consume the recommended amount of calcium daily based on your age. Depending on your overall health, your health care provider may recommend increased calcium intake.General daily calcium intake recommendations by age are:  Birth to 6 months: 200 mg.  Infants 7 to 12 months: 260 mg.  Children 1 to 3 years: 700 mg.  Children 4 to 8 years: 1,000 mg.  Children 9 to 13 years: 1,300 mg.  Teens 14 to 18 years: 1,300 mg.  Adults 19 to 50 years: 1,000 mg.  Adult women 51 to 70 years: 1,200 mg.  Adult men 51 to 70 years: 1,000 mg.  Adults 71 years and older: 1,200 mg.  Pregnant and breastfeeding teens: 1,300 mg.  Pregnant and breastfeeding adults: 1,000 mg.  What do I need to know about calcium intake?  In  order for the body to absorb calcium, it needs vitamin D. You can get vitamin D through (we recommend getting [phone] units of Vitamin D daily) ? Direct exposure of the skin to sunlight. ? Foods, such as egg yolks, liver, saltwater fish, and fortified milk. ? Supplements.  Consuming too much calcium may cause: ? Constipation. ? Decreased absorption of iron and zinc. ? Kidney stones.  Calcium supplements may interact with certain medicines. Check with your health care provider before starting any calcium supplements.  Try to get most of your calcium from food. What foods can I eat? Grains  Fortified oatmeal. Fortified ready-to-eat cereals. Fortified frozen waffles. Vegetables Turnip greens. Broccoli. Fruits Fortified orange juice. Meats and Other Protein Sources Canned sardines with bones. Canned salmon with bones. Soy beans. Tofu. Baked beans. Almonds. [place] nuts. Sunflower seeds. Dairy Milk. Yogurt. Cheese. Cottage cheese. Beverages Fortified soy milk. Fortified rice milk. Sweets/Desserts Pudding. Ice Cream. Milkshakes. Blackstrap molasses. The items listed above may not be a complete list of recommended foods or beverages. Contact your dietitian for more options. What foods can affect my calcium intake? It may be more difficult for your body to use calcium or calcium may leave your body more quickly if you consume large amounts of:  Sodium.  Protein.  Caffeine.  Alcohol.  This information is not intended to replace advice given to you by your health care provider. Make sure you discuss any questions you have with your health care provider. Document Released: 02/14/2004 Document Revised: 01/20/2016 Document Reviewed: 12/08/2013 Elsevier Interactive Patient Education  2018 [employer].

## 2018-04-11 NOTE — Progress Notes (Signed)
Subjective  CC:  Chief Complaint  Patient presents with  . Follow-up    Patient states she did Blood Work at Dr. Lorie Apley office and TSH was high   . Hypertension    HPI: Jillian Lopez is a 47 y.o. female who presents to the office today to address the problems listed above in the chief complaint.  Hypertension f/u: Control is good . Pt reports she is doing well. taking medications as instructed, no medication side effects noted, no TIAs, no chest pain on exertion, no dyspnea on exertion, no swelling of ankles. She denies adverse effects from his BP medications. Compliance with medication is good.   Had UC flare improved with prednisone. Recent f/u with GI was reassuring. However, had abnormal labs; we called for records but not yet available for review.   Has developed a sensitivity to tampons. Regular menses but flow is variable. Some perimenopausal sxs: hot flushes, fatigue, short temper. No depression. Due mammogram.   Itching ear canals with flaking.   Ros- negative LE edema, change in skin, heat or cold intolerance.  Assessment  1. Essential hypertension   2. Abnormal TSH   3. Perimenopause   4. Eczema of both external ears   5. Need for influenza vaccination   6. Perimenopausal symptom      Plan    Hypertension f/u: BP control is fairly well controlled. Continue current meds and start checking outside of the office to ensure at goal.   Hyperlipidemia f/u: check nonfasting labs today  Ear eczema; tac cream prn  Perimenopause: tampon sensitivity may be due to vaginal atrophy; monitor for now. Discussed perimenopausal mgt of sxs. Pt defers for now.   Recheck thyroid studies.  Education regarding management of these chronic disease states was given. Management strategies discussed on successive visits include dietary and exercise recommendations, goals of achieving and maintaining IBW, and lifestyle modifications aiming for adequate sleep and minimizing stressors.    Follow up: Return in about 6 months (around 10/10/2018) for complete physical, follow up Hypertension.  Orders Placed This Encounter  Procedures  . Flu Vaccine QUAD 36+ mos IM  . CBC with Differential/Platelet  . Comprehensive metabolic panel  . Lipid panel  . TSH  . T4, free  . T3   Meds ordered this encounter  Medications  . triamcinolone cream (KENALOG) 0.1 %    Sig: Apply 1 application topically 2 (two) times daily. As needed    Dispense:  30 g    Refill:  0      BP Readings from Last 3 Encounters:  04/11/18 138/80  09/06/17 (!) 152/110  05/14/14 132/64   Wt Readings from Last 3 Encounters:  04/11/18 202 lb 9.6 oz (91.9 kg)  09/06/17 207 lb 3.2 oz (94 kg)  06/02/14 202 lb (91.6 kg)    Lab Results  Component Value Date   CHOL 182 04/11/2018   Lab Results  Component Value Date   HDL 37.40 (L) 04/11/2018   Lab Results  Component Value Date   LDLCALC 115 (H) 04/11/2018   Lab Results  Component Value Date   TRIG 147.0 04/11/2018   Lab Results  Component Value Date   CHOLHDL 5 04/11/2018   No results found for: LDLDIRECT Lab Results  Component Value Date   CREATININE 0.85 04/11/2018   BUN 16 04/11/2018   NA 135 04/11/2018   K 4.3 04/11/2018   CL 104 04/11/2018   CO2 24 04/11/2018    The 10-year ASCVD risk  score Mikey Bussing DC Jr., et al., 2013) is: 2.4%   Values used to calculate the score:     Age: 35 years     Sex: Female     Is Non-Hispanic African American: No     Diabetic: No     Tobacco smoker: No     Systolic Blood Pressure: 820 mmHg     Is BP treated: Yes     HDL Cholesterol: 37.4 mg/dL     Total Cholesterol: 182 mg/dL  I reviewed the patients updated PMH, FH, and SocHx.    Patient Active Problem List   Diagnosis Date Noted  . Eczema of both external ears 04/11/2018  . Perimenopausal symptom 04/11/2018  . Cervical disc disorder at C5-C6 level with radiculopathy 09/06/2017  . Essential hypertension 03/02/2016  . Ulcerative  pancolitis without complication ([place]) 60/15/6153    Allergies: Patient has no known allergies.  Social History: Patient  reports that she quit smoking about 19 years ago. Her smoking use included cigarettes. She has a 1.50 pack-year smoking history. She has never used smokeless tobacco. She reports that she drinks alcohol. She reports that she does not use drugs.  Current Meds  Medication Sig  . lisinopril (PRINIVIL,ZESTRIL) 10 MG tablet Take 1 tablet (10 mg total) by mouth daily.    Review of Systems: Cardiovascular: negative for chest pain, palpitations, leg swelling, orthopnea Respiratory: negative for SOB, wheezing or persistent cough Gastrointestinal: negative for abdominal pain Genitourinary: negative for dysuria or gross hematuria  Objective  Vitals: BP 138/80   Pulse 79   Temp 98.7 F (37.1 C)   Ht 5' 7"  (1.702 m)   Wt 202 lb 9.6 oz (91.9 kg)   SpO2 98%   BMI 31.73 kg/m  General: no acute distress  Psych:  Alert and oriented, normal mood and affect HEENT:  Normocephalic, atraumatic, supple neck , nl tms Cardiovascular:  RRR without murmur. no edema Respiratory:  Good breath sounds bilaterally, CTAB with normal respiratory effort Skin:  Warm, no rashes, mild flaking in ear canls Neurologic:   Mental status is normal, no tremor  Commons side effects, risks, benefits, and alternatives for medications and treatment plan prescribed today were discussed, and the patient expressed understanding of the given instructions. Patient is instructed to call or message via MyChart if he/she has any questions or concerns regarding our treatment plan. No barriers to understanding were identified. We discussed Red Flag symptoms and signs in detail. Patient expressed understanding regarding what to do in case of urgent or emergency type symptoms.   Medication list was reconciled, printed and provided to the patient in AVS. Patient instructions and summary information was reviewed with the  patient as documented in the AVS. This note was prepared with assistance of Dragon voice recognition software. Occasional wrong-word or sound-a-like substitutions may have occurred due to the inherent limitations of voice recognition software

## 2018-04-12 LAB — T3: T3, Total: 85 ng/dL (ref 76–181)

## 2018-05-16 DIAGNOSIS — K5190 Ulcerative colitis, unspecified, without complications: Secondary | ICD-10-CM | POA: Diagnosis not present

## 2018-05-16 DIAGNOSIS — K529 Noninfective gastroenteritis and colitis, unspecified: Secondary | ICD-10-CM | POA: Diagnosis not present

## 2018-05-16 DIAGNOSIS — Z1211 Encounter for screening for malignant neoplasm of colon: Secondary | ICD-10-CM | POA: Diagnosis not present

## 2018-05-20 ENCOUNTER — Other Ambulatory Visit: Payer: Self-pay | Admitting: Gastroenterology

## 2018-05-20 DIAGNOSIS — Z1231 Encounter for screening mammogram for malignant neoplasm of breast: Secondary | ICD-10-CM

## 2018-07-02 ENCOUNTER — Ambulatory Visit
Admission: RE | Admit: 2018-07-02 | Discharge: 2018-07-02 | Disposition: A | Discharge status: Home / Self Care | Payer: BLUE CROSS/BLUE SHIELD | Source: Ambulatory Visit | Attending: Gastroenterology | Admitting: Gastroenterology

## 2018-07-02 DIAGNOSIS — Z1231 Encounter for screening mammogram for malignant neoplasm of breast: Secondary | ICD-10-CM

## 2018-09-17 ENCOUNTER — Other Ambulatory Visit: Payer: Self-pay | Admitting: Family Medicine

## 2018-09-17 NOTE — Telephone Encounter (Signed)
Needs OV.  

## 2018-10-20 ENCOUNTER — Other Ambulatory Visit: Payer: Self-pay | Admitting: Family Medicine

## 2018-11-20 ENCOUNTER — Other Ambulatory Visit: Payer: Self-pay | Admitting: Family Medicine

## 2018-11-20 NOTE — Telephone Encounter (Signed)
LM for pt to return call to schedule appointment

## 2018-12-20 DIAGNOSIS — W010XXA Fall on same level from slipping, tripping and stumbling without subsequent striking against object, initial encounter: Secondary | ICD-10-CM | POA: Diagnosis not present

## 2018-12-20 DIAGNOSIS — S4992XA Unspecified injury of left shoulder and upper arm, initial encounter: Secondary | ICD-10-CM | POA: Diagnosis not present

## 2018-12-20 DIAGNOSIS — S299XXA Unspecified injury of thorax, initial encounter: Secondary | ICD-10-CM | POA: Diagnosis not present

## 2018-12-20 DIAGNOSIS — G8911 Acute pain due to trauma: Secondary | ICD-10-CM | POA: Diagnosis not present

## 2018-12-20 DIAGNOSIS — S40012A Contusion of left shoulder, initial encounter: Secondary | ICD-10-CM | POA: Diagnosis not present

## 2018-12-20 DIAGNOSIS — Y929 Unspecified place or not applicable: Secondary | ICD-10-CM | POA: Diagnosis not present

## 2018-12-20 DIAGNOSIS — M25512 Pain in left shoulder: Secondary | ICD-10-CM | POA: Diagnosis not present

## 2018-12-20 DIAGNOSIS — Y93H2 Activity, gardening and landscaping: Secondary | ICD-10-CM | POA: Diagnosis not present

## 2018-12-22 ENCOUNTER — Ambulatory Visit (INDEPENDENT_AMBULATORY_CARE_PROVIDER_SITE_OTHER): Payer: BC Managed Care – PPO | Admitting: Orthopedic Surgery

## 2018-12-22 ENCOUNTER — Other Ambulatory Visit: Payer: Self-pay

## 2018-12-22 ENCOUNTER — Encounter: Payer: Self-pay | Admitting: Orthopedic Surgery

## 2018-12-22 DIAGNOSIS — M25512 Pain in left shoulder: Secondary | ICD-10-CM | POA: Diagnosis not present

## 2018-12-22 DIAGNOSIS — S46012A Strain of muscle(s) and tendon(s) of the rotator cuff of left shoulder, initial encounter: Secondary | ICD-10-CM

## 2018-12-22 NOTE — Progress Notes (Signed)
Left shoulder rotator cuff tear   Office Visit Note   Patient: Jillian Lopez           Date of Birth: 02-05-1971           MRN: [id number] Visit Date: 12/22/2018 Requested by: Leamon Arnt, [place], [place] [address] PCP: Leamon Arnt, MD  Subjective: Chief Complaint  Patient presents with  . 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  She had a fall on her outstretched left arm which is her nondominant arm 01/11/2019.  [address] while she was in [employer].  She had nauseating type pain.  She could raise her arm at first but now she is having difficulty raising the arm.  Radiographs done at the hospital were negative for fracture.  Difficult for her to sleep on that left-hand side.  She is been doing ice as well as medication including ibuprofen and hydrocodone x1+ Tylenol.  She does report some catching but reports primarily most of her pain in the anterior aspect of the shoulder.  Denies any prior injury to the left shoulder.  Does have a history of C5 fusion done last year.              ROS: All systems reviewed are negative as they relate to the chief complaint within the history of present illness.  Patient denies  fevers or chills.   Assessment & Plan: Visit Diagnoses:  1. Traumatic complete tear of left rotator cuff, initial encounter   2. Acute pain of left shoulder     Plan: Impression is subscapularis tear left shoulder which is traumatic.  Retraction is 1 to 2 cm at this time based on ultrasound examination.  The biceps tendon remains localized in the bicipital groove.  Plan is MRI arthrogram to evaluate for the more full extent of the subscapularis tear.  I think this is going to be a surgical problem for this patient based on her exam findings and ultrasound findings.  We would like to expedite this scan based on her summer timing issues.  Follow-Up Instructions: Return for after MRI.   Orders:  Orders  Placed This Encounter  Procedures  . MR Shoulder Left w/ contrast  . Arthrogram   No orders of the defined types were placed in this encounter.     Procedures: No procedures performed   Clinical Data: No additional findings.  Objective: Vital Signs: There were no vitals taken for this visit.  Physical Exam:   Constitutional: Patient appears well-developed HEENT:  Head: Normocephalic Eyes:EOM are normal Neck: Normal range of motion Cardiovascular: Normal rate Pulmonary/chest: Effort normal Neurologic: Patient is alert Skin: Skin is warm Psychiatric: Patient has normal mood and affect    Ortho Exam: Ortho exam demonstrates good cervical spine range of motion.  No definite paresthesias C5-T1.  She has palpable radial pulse bilaterally.  External rotation in both arms is about 80 degrees.  She does have subscap weakness on the left compared to the right.  Supraspinatus infraspinatus testing is pretty reasonable on both sides.  Forward flexion actively is just shy of 90 degrees.  Active abduction also only about 50 degrees on the left-hand side.  On the right she has full active and passive range of motion of the right shoulder.  Specialty Comments:  No specialty comments available.  Imaging: No results found.   PMFS History: Patient Active Problem List   Diagnosis  Date Noted  . Eczema of both external ears 04/11/2018  . Perimenopausal symptom 04/11/2018  . Cervical disc disorder at C5-C6 level with radiculopathy 09/06/2017  . Essential hypertension 03/02/2016  . Ulcerative pancolitis without complication ([facility]) 22/30/0979   Past Medical History:  Diagnosis Date  . Arthritis   . Hypertension   . Ulcerative pancolitis without complication ([facility]) 49/97/1820    Family History  Problem Relation Age of Onset  . Arthritis Mother   . Lung cancer Father        small cell ca  . Hypertension Brother   . Stroke Maternal Grandfather   . Ulcerative colitis Maternal  Grandfather   . Melanoma Paternal Grandmother   . Ovarian cancer Paternal Aunt   . Diabetes Paternal Aunt     History reviewed. No pertinent surgical history. Social History   Occupational History  . Occupation: Therapist, sports: [employer]  Tobacco Use  . Smoking status: Former Smoker    Packs/day: 0.15    Years: 10.00    Pack years: 1.50    Types: Cigarettes    Last attempt to quit: 07/16/1998    Years since quitting: 20.4  . Smokeless tobacco: Never Used  Substance and Sexual Activity  . Alcohol use: Yes  . Drug use: No  . Sexual activity: Yes

## 2018-12-23 ENCOUNTER — Other Ambulatory Visit: Payer: Self-pay | Admitting: Family Medicine

## 2018-12-24 ENCOUNTER — Ambulatory Visit: Payer: BLUE CROSS/BLUE SHIELD | Admitting: Orthopedic Surgery

## 2018-12-24 DIAGNOSIS — M25512 Pain in left shoulder: Secondary | ICD-10-CM | POA: Diagnosis not present

## 2018-12-24 NOTE — Telephone Encounter (Signed)
Please call to schedule her for a complete physical within the next 3 months. Thanks. I have refilled her blood pressure medication.

## 2018-12-26 ENCOUNTER — Other Ambulatory Visit: Payer: Self-pay

## 2018-12-26 ENCOUNTER — Ambulatory Visit (INDEPENDENT_AMBULATORY_CARE_PROVIDER_SITE_OTHER): Payer: BC Managed Care – PPO | Admitting: Orthopedic Surgery

## 2018-12-26 ENCOUNTER — Encounter: Payer: Self-pay | Admitting: Orthopedic Surgery

## 2018-12-26 ENCOUNTER — Other Ambulatory Visit (HOSPITAL_COMMUNITY)
Admission: RE | Admit: 2018-12-26 | Discharge: 2018-12-26 | Disposition: A | Discharge status: Home / Self Care | Payer: BC Managed Care – PPO | Source: Ambulatory Visit | Attending: Orthopedic Surgery | Admitting: Orthopedic Surgery

## 2018-12-26 DIAGNOSIS — Z1159 Encounter for screening for other viral diseases: Secondary | ICD-10-CM | POA: Insufficient documentation

## 2018-12-26 DIAGNOSIS — S46012A Strain of muscle(s) and tendon(s) of the rotator cuff of left shoulder, initial encounter: Secondary | ICD-10-CM | POA: Diagnosis not present

## 2018-12-26 DIAGNOSIS — Z01812 Encounter for preprocedural laboratory examination: Secondary | ICD-10-CM | POA: Diagnosis not present

## 2018-12-27 LAB — NOVEL CORONAVIRUS, NAA (HOSP ORDER, SEND-OUT TO REF LAB; TAT 18-24 HRS): SARS-CoV-2, NAA: NOT DETECTED

## 2018-12-29 ENCOUNTER — Other Ambulatory Visit: Payer: Self-pay

## 2018-12-29 ENCOUNTER — Encounter (HOSPITAL_COMMUNITY): Payer: Self-pay | Admitting: *Deleted

## 2018-12-29 DIAGNOSIS — S43082A Other subluxation of left shoulder joint, initial encounter: Secondary | ICD-10-CM | POA: Diagnosis not present

## 2018-12-29 DIAGNOSIS — X58XXXA Exposure to other specified factors, initial encounter: Secondary | ICD-10-CM | POA: Diagnosis not present

## 2018-12-29 DIAGNOSIS — I10 Essential (primary) hypertension: Secondary | ICD-10-CM | POA: Diagnosis not present

## 2018-12-29 DIAGNOSIS — Z87891 Personal history of nicotine dependence: Secondary | ICD-10-CM | POA: Diagnosis not present

## 2018-12-29 DIAGNOSIS — S46012A Strain of muscle(s) and tendon(s) of the rotator cuff of left shoulder, initial encounter: Secondary | ICD-10-CM | POA: Diagnosis not present

## 2018-12-29 NOTE — Progress Notes (Signed)
Jillian Lopez denies chest pain or shortness of breath. Jillian Lopez reports that that she is aware that she was suppose to make an appointment with Dr. Cathlean Marseilles, last fall , but did not; "Dr Jonni Sanger moved locations and then this COVID thing."Jillian. Lopez states that she has been taking blood pressure as ordered , she has not had her blood pressure check since she saw Dr. Jonni Sanger. Jillian Lopez denies that she nor her family has experienced any of the following: Cough Fever >100.4 Runny Nose Sore Throat Difficulty breathing/ shortness of breath Travel in past 14 days - no Patient had COVID test 12/26/2018 and reports that she has been in [place] with her wife. I instructed patient that she is to not eat food after midnight.   I instructed patient that she may drink clear liquids until  4:30 am. I reviewed with patient what clear liquids consist of Carbonated Beverages, Water, Clear Tea, Black Coffee only, Juice (non-citric and without pulp), Gatorade, Plain Jell-O  only, Plain Popsicles only; patient verbalized understand.  I asked patient if she could come by [facility] to pick up  a Pre surgery drink , she said yes; I instructed patient to drink Pre- Surgery drink between 4:00 and 4:30 AM

## 2018-12-29 NOTE — Anesthesia Preprocedure Evaluation (Addendum)
Anesthesia Evaluation  Patient identified by MRN, date of birth, ID band Patient awake    Reviewed: Allergy & Precautions, H&P , NPO status , Patient's Chart, lab work & pertinent test results  History of Anesthesia Complications (+) PONV and history of anesthetic complications  Airway Mallampati: II   Neck ROM: full    Dental   Pulmonary former smoker,    breath sounds clear to auscultation       Cardiovascular hypertension,  Rhythm:regular Rate:Normal     Neuro/Psych    GI/Hepatic PUD, GERD  ,  Endo/Other    Renal/GU      Musculoskeletal  (+) Arthritis ,   Abdominal   Peds  Hematology   Anesthesia Other Findings   Reproductive/Obstetrics                            Anesthesia Physical Anesthesia Plan  ASA: II  Anesthesia Plan: General   Post-op Pain Management:  Regional for Post-op pain   Induction: Intravenous  PONV Risk Score and Plan: 4 or greater and Ondansetron, Dexamethasone, Midazolam and Treatment may vary due to age or medical condition  Airway Management Planned: Oral ETT  Additional Equipment:   Intra-op Plan:   Post-operative Plan: Extubation in OR  Informed Consent: I have reviewed the patients History and Physical, chart, labs and discussed the procedure including the risks, benefits and alternatives for the proposed anesthesia with the patient or authorized representative who has indicated his/her understanding and acceptance.       Plan Discussed with: CRNA, Anesthesiologist and Surgeon  Anesthesia Plan Comments: (PAT note written 12/29/2018 by Myra Gianotti, PA-C. Same day work-up. )       Anesthesia Quick Evaluation

## 2018-12-29 NOTE — Progress Notes (Signed)
Anesthesia Chart Review: SAME DAY WORK-UP    Case: [id number] Date/Time: 12/30/18 1023   Procedure: LEFT SHOULDER ARTHROSCOPY, BICEPS RELEASE, OPEN SUBSCAP REPAIR WITH BICEPS TENODESIS (Left )   Anesthesia type: General   Pre-op diagnosis: left shoulder subcapularis tear and biceps subluxation   Location: [facility] OR ROOM 06 / [facility]   Surgeon: Meredith Pel, MD      DISCUSSION: Patient is a 48 year old female scheduled for the above procedure.  History includes former smoker (quit 2000), post-operative N/V, HTN, ulcerative colitis, GERD, cervical fusion.   She is a same day work-up, so she will get updated labs and EKG on the day of surgery. Definitve plan following anesthesiologist evaluation.   VS: Ht 5' 7"  (1.702 m)   Wt 90.7 kg   LMP 12/28/2018   BMI 31.32 kg/m   Wt Readings from Last 3 Encounters:  04/11/18 91.9 kg  09/06/17 94 kg  06/02/14 91.6 kg   Temp Readings from Last 3 Encounters:  04/11/18 37.1 C  09/06/17 37.6 C  05/14/14 36.9 C (Oral)   BP Readings from Last 3 Encounters:  04/11/18 138/80  09/06/17 (!) 152/110  05/14/14 132/64   Pulse Readings from Last 3 Encounters:  04/11/18 79  09/06/17 79  05/14/14 78   - BP 152/110 was in the setting of neck pain and running out of BP medications. Patient reported to PAT phone RN that she has been compliant with taking her lisinopril.    PROVIDERS: Leamon Arnt, MD is PCP Juanita Craver, MD is GI   LABS: Last labs as of 04/11/18 show a normal CBC, thyroid panel, and CMET. She will need updated labs on the day of surgery.    EKG: Needs an update EKG on arrival. Last tracing on 09/06/17 showed: Sinus  Rhythm. Anteroseptal infarct, age undetermined. Possibly due to lead placement. Repeat tracing recommended, and Dr. Jonni Sanger would possibly consider echocardiogram if persistent finding, although she felt patient overall low risk for CAD at that time given primary risk factor was HTN and max METS >10. Patient was  scheduled for cervical discectomy later that month, and Dr. Jonni Sanger did not think repeat EKG or further evaluation was needed prior to that surgery. At her next visit with Dr. Jonni Sanger on 04/11/18, no repeat EKG ordered.     CV: N/A   Past Medical History:  Diagnosis Date  . Arthritis   . Complication of anesthesia   . GERD (gastroesophageal reflux disease)    at times  . Hypertension   . PONV (postoperative nausea and vomiting)   . Ulcerative pancolitis without complication ([place]) 12/87/8676    Past Surgical History:  Procedure Laterality Date  . CERVICAL FUSION     Cervical 5- 6  . COLONOSCOPY     numerous    MEDICATIONS: No current facility-administered medications for this encounter.    Marland Kitchen acetaminophen (TYLENOL) 500 MG tablet  . esomeprazole (NEXIUM) 20 MG capsule  . ibuprofen (ADVIL) 200 MG tablet  . lisinopril (ZESTRIL) 10 MG tablet  . triamcinolone cream (KENALOG) 0.1 %    Myra Gianotti, PA-C Surgical Short Stay/Anesthesiology [facility] Phone [phone] [facility] Phone [phone] 12/29/2018 2:21 PM

## 2018-12-30 ENCOUNTER — Ambulatory Visit (HOSPITAL_COMMUNITY)
Admission: RE | Admit: 2018-12-30 | Discharge: 2018-12-30 | Disposition: A | Discharge status: Home / Self Care | Payer: BC Managed Care – PPO | Attending: Orthopedic Surgery | Admitting: Orthopedic Surgery

## 2018-12-30 ENCOUNTER — Encounter (HOSPITAL_COMMUNITY)
Admission: RE | Disposition: A | Discharge status: Home / Self Care | Payer: Self-pay | Source: Home / Self Care | Attending: Orthopedic Surgery

## 2018-12-30 ENCOUNTER — Encounter (HOSPITAL_COMMUNITY): Payer: Self-pay

## 2018-12-30 ENCOUNTER — Encounter: Payer: Self-pay | Admitting: Orthopedic Surgery

## 2018-12-30 ENCOUNTER — Ambulatory Visit (HOSPITAL_COMMUNITY): Payer: BC Managed Care – PPO | Admitting: Vascular Surgery

## 2018-12-30 DIAGNOSIS — X58XXXA Exposure to other specified factors, initial encounter: Secondary | ICD-10-CM | POA: Diagnosis not present

## 2018-12-30 DIAGNOSIS — S43432A Superior glenoid labrum lesion of left shoulder, initial encounter: Secondary | ICD-10-CM | POA: Diagnosis not present

## 2018-12-30 DIAGNOSIS — I10 Essential (primary) hypertension: Secondary | ICD-10-CM | POA: Diagnosis not present

## 2018-12-30 DIAGNOSIS — Z87891 Personal history of nicotine dependence: Secondary | ICD-10-CM | POA: Insufficient documentation

## 2018-12-30 DIAGNOSIS — S43082A Other subluxation of left shoulder joint, initial encounter: Secondary | ICD-10-CM | POA: Insufficient documentation

## 2018-12-30 DIAGNOSIS — G8918 Other acute postprocedural pain: Secondary | ICD-10-CM | POA: Diagnosis not present

## 2018-12-30 DIAGNOSIS — S46012D Strain of muscle(s) and tendon(s) of the rotator cuff of left shoulder, subsequent encounter: Secondary | ICD-10-CM

## 2018-12-30 DIAGNOSIS — S46012A Strain of muscle(s) and tendon(s) of the rotator cuff of left shoulder, initial encounter: Secondary | ICD-10-CM | POA: Diagnosis not present

## 2018-12-30 DIAGNOSIS — M5010 Cervical disc disorder with radiculopathy, unspecified cervical region: Secondary | ICD-10-CM | POA: Diagnosis not present

## 2018-12-30 DIAGNOSIS — S46212A Strain of muscle, fascia and tendon of other parts of biceps, left arm, initial encounter: Secondary | ICD-10-CM | POA: Diagnosis not present

## 2018-12-30 HISTORY — DX: Other specified postprocedural states: R11.2

## 2018-12-30 HISTORY — DX: Gastro-esophageal reflux disease without esophagitis: K21.9

## 2018-12-30 HISTORY — DX: Other complications of anesthesia, initial encounter: T88.59XA

## 2018-12-30 HISTORY — DX: Other specified postprocedural states: Z98.890

## 2018-12-30 HISTORY — PX: SHOULDER ARTHROSCOPY WITH BICEPS TENDON REPAIR: SHX5674

## 2018-12-30 LAB — CBC
HCT: 42.3 % (ref 36.0–46.0)
Hemoglobin: 13.8 g/dL (ref 12.0–15.0)
MCH: 32.1 pg (ref 26.0–34.0)
MCHC: 32.6 g/dL (ref 30.0–36.0)
MCV: 98.4 fL (ref 80.0–100.0)
Platelets: 314 10*3/uL (ref 150–400)
RBC: 4.3 MIL/uL (ref 3.87–5.11)
RDW: 13.2 % (ref 11.5–15.5)
WBC: 8.2 10*3/uL (ref 4.0–10.5)
nRBC: 0 % (ref 0.0–0.2)

## 2018-12-30 LAB — BASIC METABOLIC PANEL
Anion gap: 16 — ABNORMAL HIGH (ref 5–15)
BUN: 15 mg/dL (ref 6–20)
CO2: 14 mmol/L — ABNORMAL LOW (ref 22–32)
Calcium: 8.9 mg/dL (ref 8.9–10.3)
Chloride: 110 mmol/L (ref 98–111)
Creatinine, Ser: 0.89 mg/dL (ref 0.44–1.00)
GFR calc Af Amer: >60 mL/min (ref >60)
GFR calc non Af Amer: >60 mL/min (ref >60)
Glucose, Bld: 96 mg/dL (ref 70–99)
Potassium: 4.3 mmol/L (ref 3.5–5.1)
Sodium: 140 mmol/L (ref 135–145)

## 2018-12-30 LAB — POCT PREGNANCY, URINE: Preg Test, Ur: NEGATIVE

## 2018-12-30 SURGERY — SHOULDER ARTHROSCOPY WITH BICEPS TENDON REPAIR
Anesthesia: Regional | Site: Shoulder | Laterality: Left

## 2018-12-30 MED ORDER — FENTANYL CITRATE (PF) 100 MCG/2ML IJ SOLN
100.0000 ug | Freq: Once | INTRAMUSCULAR | Status: AC
  Administered 2018-12-30: 09:00:00 100 ug via INTRAVENOUS

## 2018-12-30 MED ORDER — FENTANYL CITRATE (PF) 100 MCG/2ML IJ SOLN: 25.0000 ug | INTRAMUSCULAR | Status: DC | PRN

## 2018-12-30 MED ORDER — PROPOFOL 10 MG/ML IV BOLUS
INTRAVENOUS | Status: AC
  Filled 2018-12-30: qty 20

## 2018-12-30 MED ORDER — ROCURONIUM BROMIDE 10 MG/ML (PF) SYRINGE
PREFILLED_SYRINGE | INTRAVENOUS | Status: AC
  Filled 2018-12-30: qty 10

## 2018-12-30 MED ORDER — LIDOCAINE 2% (20 MG/ML) 5 ML SYRINGE
INTRAMUSCULAR | Status: DC | PRN
  Administered 2018-12-30: 60 mg via INTRAVENOUS

## 2018-12-30 MED ORDER — 0.9 % SODIUM CHLORIDE (POUR BTL) OPTIME
TOPICAL | Status: DC | PRN
  Administered 2018-12-30: 1000 mL

## 2018-12-30 MED ORDER — PROPOFOL 10 MG/ML IV BOLUS
INTRAVENOUS | Status: DC | PRN
  Administered 2018-12-30: 200 mg via INTRAVENOUS

## 2018-12-30 MED ORDER — CEFAZOLIN SODIUM-DEXTROSE 2-4 GM/100ML-% IV SOLN
2.0000 g | INTRAVENOUS | Status: AC
  Administered 2018-12-30: 2 g via INTRAVENOUS

## 2018-12-30 MED ORDER — SODIUM CHLORIDE 0.9 % IV SOLN
INTRAVENOUS | Status: DC | PRN
  Administered 2018-12-30: 25 ug/min via INTRAVENOUS

## 2018-12-30 MED ORDER — LIDOCAINE 2% (20 MG/ML) 5 ML SYRINGE
INTRAMUSCULAR | Status: AC
  Filled 2018-12-30: qty 5

## 2018-12-30 MED ORDER — ROCURONIUM BROMIDE 50 MG/5ML IV SOSY
PREFILLED_SYRINGE | INTRAVENOUS | Status: DC | PRN
  Administered 2018-12-30: 10 mg via INTRAVENOUS
  Administered 2018-12-30: 50 mg via INTRAVENOUS
  Administered 2018-12-30: 10 mg via INTRAVENOUS

## 2018-12-30 MED ORDER — SODIUM CHLORIDE 0.9 % IR SOLN
Status: DC | PRN
  Administered 2018-12-30: 3000 mL

## 2018-12-30 MED ORDER — BUPIVACAINE-EPINEPHRINE (PF) 0.5% -1:200000 IJ SOLN
INTRAMUSCULAR | Status: DC | PRN
  Administered 2018-12-30: 15 mL via PERINEURAL

## 2018-12-30 MED ORDER — ONDANSETRON HCL 4 MG/2ML IJ SOLN
4.0000 mg | Freq: Four times a day (QID) | INTRAMUSCULAR | Status: AC | PRN
  Administered 2018-12-30: 4 mg via INTRAVENOUS

## 2018-12-30 MED ORDER — ONDANSETRON HCL 4 MG/2ML IJ SOLN
INTRAMUSCULAR | Status: AC
  Filled 2018-12-30: qty 2

## 2018-12-30 MED ORDER — SUGAMMADEX SODIUM 200 MG/2ML IV SOLN
INTRAVENOUS | Status: DC | PRN
  Administered 2018-12-30: 200 mg via INTRAVENOUS

## 2018-12-30 MED ORDER — SUCCINYLCHOLINE CHLORIDE 200 MG/10ML IV SOSY
PREFILLED_SYRINGE | INTRAVENOUS | Status: AC
  Filled 2018-12-30: qty 10

## 2018-12-30 MED ORDER — SUCCINYLCHOLINE CHLORIDE 20 MG/ML IJ SOLN
INTRAMUSCULAR | Status: DC | PRN
  Administered 2018-12-30: 100 mg via INTRAVENOUS

## 2018-12-30 MED ORDER — OXYCODONE HCL 5 MG/5ML PO SOLN: 5.0000 mg | Freq: Once | ORAL | Status: DC | PRN

## 2018-12-30 MED ORDER — ONDANSETRON HCL 4 MG/2ML IJ SOLN
INTRAMUSCULAR | Status: DC | PRN
  Administered 2018-12-30: 4 mg via INTRAVENOUS

## 2018-12-30 MED ORDER — OXYCODONE HCL 5 MG PO TABS: 5.0000 mg | ORAL_TABLET | Freq: Once | ORAL | Status: DC | PRN

## 2018-12-30 MED ORDER — MIDAZOLAM HCL 2 MG/2ML IJ SOLN
2.0000 mg | Freq: Once | INTRAMUSCULAR | Status: AC
  Administered 2018-12-30: 2 mg via INTRAVENOUS

## 2018-12-30 MED ORDER — DEXAMETHASONE SODIUM PHOSPHATE 10 MG/ML IJ SOLN
INTRAMUSCULAR | Status: AC
  Filled 2018-12-30: qty 1

## 2018-12-30 MED ORDER — BUPIVACAINE LIPOSOME 1.3 % IJ SUSP
INTRAMUSCULAR | Status: DC | PRN
  Administered 2018-12-30: 10 mL via PERINEURAL

## 2018-12-30 MED ORDER — FENTANYL CITRATE (PF) 100 MCG/2ML IJ SOLN
INTRAMUSCULAR | Status: AC
  Administered 2018-12-30: 100 ug via INTRAVENOUS
  Filled 2018-12-30: qty 2

## 2018-12-30 MED ORDER — LACTATED RINGERS IV SOLN
INTRAVENOUS | Status: DC
  Administered 2018-12-30: 09:00:00 via INTRAVENOUS

## 2018-12-30 MED ORDER — CEFAZOLIN SODIUM-DEXTROSE 2-4 GM/100ML-% IV SOLN
INTRAVENOUS | Status: AC
  Filled 2018-12-30: qty 100

## 2018-12-30 MED ORDER — FENTANYL CITRATE (PF) 100 MCG/2ML IJ SOLN
INTRAMUSCULAR | Status: DC | PRN
  Administered 2018-12-30 (×2): 50 ug via INTRAVENOUS

## 2018-12-30 MED ORDER — DEXAMETHASONE SODIUM PHOSPHATE 10 MG/ML IJ SOLN
INTRAMUSCULAR | Status: DC | PRN
  Administered 2018-12-30: 10 mg via INTRAVENOUS

## 2018-12-30 MED ORDER — FENTANYL CITRATE (PF) 250 MCG/5ML IJ SOLN
INTRAMUSCULAR | Status: AC
  Filled 2018-12-30: qty 5

## 2018-12-30 MED ORDER — CHLORHEXIDINE GLUCONATE 4 % EX LIQD: 60.0000 mL | Freq: Once | CUTANEOUS | Status: DC

## 2018-12-30 MED ORDER — MIDAZOLAM HCL 2 MG/2ML IJ SOLN
INTRAMUSCULAR | Status: AC
  Administered 2018-12-30: 2 mg via INTRAVENOUS
  Filled 2018-12-30: qty 2

## 2018-12-30 SURGICAL SUPPLY — 76 items
ALCOHOL ISOPROPYL (RUBBING) (MISCELLANEOUS) ×2 IMPLANT
ANCHOR SUT BIOCOMP CORKSREW (Anchor) ×2 IMPLANT
BLADE CUTTER GATOR 3.5 (BLADE) ×2 IMPLANT
BLADE SURG 11 STRL SS (BLADE) ×2 IMPLANT
BUR OVAL 6.0 (BURR) ×2 IMPLANT
CLSR STERI-STRIP ANTIMIC 1/2X4 (GAUZE/BANDAGES/DRESSINGS) ×1 IMPLANT
COVER SURGICAL LIGHT HANDLE (MISCELLANEOUS) ×2 IMPLANT
COVER WAND RF STERILE (DRAPES) ×2 IMPLANT
DRAPE IMP U-DRAPE 54X76 (DRAPES) ×2 IMPLANT
DRAPE INCISE IOBAN 66X45 STRL (DRAPES) ×2 IMPLANT
DRAPE STERI 35X30 U-POUCH (DRAPES) ×2 IMPLANT
DRAPE U-SHAPE 47X51 STRL (DRAPES) ×4 IMPLANT
DRSG AQUACEL AG ADV 3.5X 4 (GAUZE/BANDAGES/DRESSINGS) ×2 IMPLANT
DRSG AQUACEL AG ADV 3.5X 6 (GAUZE/BANDAGES/DRESSINGS) ×1 IMPLANT
DRSG TEGADERM 4X4.75 (GAUZE/BANDAGES/DRESSINGS) ×2 IMPLANT
DURAPREP 26ML APPLICATOR (WOUND CARE) ×2 IMPLANT
ELECT REM PT RETURN 9FT ADLT (ELECTROSURGICAL) ×2
ELECTRODE REM PT RTRN 9FT ADLT (ELECTROSURGICAL) ×1 IMPLANT
FILTER STRAW FLUID ASPIR (MISCELLANEOUS) ×2 IMPLANT
GAUZE SPONGE 4X4 12PLY STRL LF (GAUZE/BANDAGES/DRESSINGS) ×1 IMPLANT
GAUZE XEROFORM 1X8 LF (GAUZE/BANDAGES/DRESSINGS) ×2 IMPLANT
GLOVE BIOGEL PI IND STRL 8 (GLOVE) ×1 IMPLANT
GLOVE BIOGEL PI INDICATOR 8 (GLOVE) ×1
GLOVE ECLIPSE 6.5 STRL STRAW (GLOVE) ×1 IMPLANT
GLOVE ECLIPSE 7.0 STRL STRAW (GLOVE) ×1 IMPLANT
GLOVE ECLIPSE 8.0 STRL XLNG CF (GLOVE) ×2 IMPLANT
GLOVE INDICATOR 7.5 STRL GRN (GLOVE) ×1 IMPLANT
GLOVE SURG ORTHO 8.0 STRL STRW (GLOVE) ×2 IMPLANT
GOWN STRL REUS W/ TWL LRG LVL3 (GOWN DISPOSABLE) ×3 IMPLANT
GOWN STRL REUS W/TWL LRG LVL3 (GOWN DISPOSABLE) ×3
JET LAVAGE IRRISEPT WOUND (IRRIGATION / IRRIGATOR) ×2
KIT BASIN OR (CUSTOM PROCEDURE TRAY) ×2 IMPLANT
KIT TURNOVER KIT B (KITS) ×2 IMPLANT
LAVAGE JET IRRISEPT WOUND (IRRIGATION / IRRIGATOR) IMPLANT
MANIFOLD NEPTUNE II (INSTRUMENTS) ×2 IMPLANT
NDL HYPO 25X1 1.5 SAFETY (NEEDLE) ×1 IMPLANT
NDL SPNL 18GX3.5 QUINCKE PK (NEEDLE) ×1 IMPLANT
NEEDLE HYPO 25X1 1.5 SAFETY (NEEDLE) ×2 IMPLANT
NEEDLE SPNL 18GX3.5 QUINCKE PK (NEEDLE) ×2 IMPLANT
NS IRRIG 1000ML POUR BTL (IV SOLUTION) ×2 IMPLANT
PACK SHOULDER (CUSTOM PROCEDURE TRAY) ×2 IMPLANT
PAD ARMBOARD 7.5X6 YLW CONV (MISCELLANEOUS) ×4 IMPLANT
PASSER SUT SWANSON 36MM LOOP (INSTRUMENTS) ×1 IMPLANT
PENCIL BUTTON HOLSTER BLD 10FT (ELECTRODE) ×1 IMPLANT
PORT APPOLLO RF 90DEGREE MULTI (SURGICAL WAND) ×1 IMPLANT
PUSHLOCK PEEK 4.5X24 (Orthopedic Implant) ×2 IMPLANT
RESTRAINT HEAD UNIVERSAL NS (MISCELLANEOUS) ×2 IMPLANT
RETRIEVER SUT HEWSON (MISCELLANEOUS) ×1 IMPLANT
SLING ARM FOAM STRAP LRG (SOFTGOODS) IMPLANT
SLING ARM IMMOBILIZER LRG (SOFTGOODS) ×1 IMPLANT
SPONGE LAP 4X18 RFD (DISPOSABLE) ×2 IMPLANT
SUCTION FRAZIER HANDLE 10FR (MISCELLANEOUS) ×1
SUCTION TUBE FRAZIER 10FR DISP (MISCELLANEOUS) IMPLANT
SUT 2 FIBERLOOP 20 STRT BLUE (SUTURE) ×2
SUT ETHILON 3 0 PS 1 (SUTURE) ×2 IMPLANT
SUT MNCRL AB 3-0 PS2 18 (SUTURE) ×1 IMPLANT
SUT PROLENE 3 0 PS 2 (SUTURE) IMPLANT
SUT SILK 2 0 TIES 17X18 (SUTURE) ×1
SUT SILK 2-0 18XBRD TIE BLK (SUTURE) IMPLANT
SUT VIC AB 0 CT1 27 (SUTURE) ×1
SUT VIC AB 0 CT1 27XBRD ANBCTR (SUTURE) IMPLANT
SUT VIC AB 1 CT1 27 (SUTURE)
SUT VIC AB 1 CT1 27XBRD ANBCTR (SUTURE) IMPLANT
SUT VIC AB 2-0 CT1 27 (SUTURE) ×2
SUT VIC AB 2-0 CT1 TAPERPNT 27 (SUTURE) ×1 IMPLANT
SUT VICRYL 0 UR6 27IN ABS (SUTURE) ×5 IMPLANT
SUTURE 2 FIBERLOOP 20 STRT BLU (SUTURE) IMPLANT
SYR 20CC LL (SYRINGE) ×4 IMPLANT
SYR TB 1ML LUER SLIP (SYRINGE) ×2 IMPLANT
SYSTEM TENDODESIS IMPLANT (Miscellaneous) ×1 IMPLANT
TOWEL OR 17X24 6PK STRL BLUE (TOWEL DISPOSABLE) ×2 IMPLANT
TOWEL OR 17X26 10 PK STRL BLUE (TOWEL DISPOSABLE) ×2 IMPLANT
TUBE CONNECTING 20X1/4 (TUBING) ×1 IMPLANT
TUBING ARTHROSCOPY IRRIG 16FT (MISCELLANEOUS) ×2 IMPLANT
WAND HAND CNTRL MULTIVAC 90 (MISCELLANEOUS) ×2 IMPLANT
WATER STERILE IRR 1000ML POUR (IV SOLUTION) ×2 IMPLANT

## 2018-12-30 NOTE — H&P (Signed)
Jillian Lopez is an 48 y.o. female.   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  Subsequent evaluation in the office demonstrated subscapularis weakness.  MRI scan demonstrates tear of the subscapularis with 1 to 2 cm retraction along with medial subluxation of the biceps tendon.  Past Medical History:  Diagnosis Date  . Arthritis   . Complication of anesthesia   . GERD (gastroesophageal reflux disease)    at times  . Hypertension   . PONV (postoperative nausea and vomiting)   . Ulcerative pancolitis without complication ([place]) 95/63/8756    Past Surgical History:  Procedure Laterality Date  . CERVICAL FUSION     Cervical 5- 6  . COLONOSCOPY     numerous    Family History  Problem Relation Age of Onset  . Arthritis Mother   . Lung cancer Father        small cell ca  . Hypertension Brother   . Stroke Maternal Grandfather   . Ulcerative colitis Maternal Grandfather   . Melanoma Paternal Grandmother   . Ovarian cancer Paternal Aunt   . Diabetes Paternal Aunt    Social History:  reports that she quit smoking about 20 years ago. Her smoking use included cigarettes. She has a 1.50 pack-year smoking history. She has never used smokeless tobacco. She reports current alcohol use of about 4.0 standard drinks of alcohol per week. She reports that she does not use drugs.  Allergies: No Known Allergies  No medications prior to admission.    No results found for this or any previous visit (from the past 48 hour(s)). No results found.  Review of Systems  Musculoskeletal: Positive for joint pain.  All other systems reviewed and are negative.   Height 5' 7"  (1.702 m), weight 90.7 kg, last menstrual period 12/28/2018. Physical Exam  Constitutional: She appears well-developed.  HENT:  Head: Normocephalic.  Eyes: Pupils are equal, round, and reactive to light.  Neck: Normal range of motion.   Cardiovascular: Normal rate.  Respiratory: Effort normal.  Neurological: She is alert.  Skin: Skin is warm.  Psychiatric: She has a normal mood and affect.  Examination of that left shoulder demonstrates subscap weakness but maintain passive range of motion.  Supraspinatus infraspinatus strength is reasonable.  No discrete AC joint tenderness on that left-hand side.  Motor sensory function to the hand is intact.  Axillary nerve is functional.  Assessment/Plan Impression is left shoulder rotator cuff tear of the subscapularis with medial subluxation of the biceps tendon.  Plan is arthroscopy with biceps tendon release and subsequent subscapularis repair done open through a deltopectoral approach.  Risk and benefits are discussed with the patient including but not limited to infection nerve vessel damage shoulder stiffness as well as potential for re-tearing of the subscapularis tendon.  Patient understands the risks and benefits and wishes to proceed.  All questions answered  Anderson Malta, MD 12/30/2018, 7:24 AM

## 2018-12-30 NOTE — Transfer of Care (Signed)
Immediate Anesthesia Transfer of Care Note  Patient: Jillian Lopez  Procedure(s) Performed: LEFT SHOULDER ARTHROSCOPY, BICEPS RELEASE, OPEN SUBSCAP REPAIR WITH BICEPS TENODESIS (Left Shoulder)  Patient Location: PACU  Anesthesia Type:General and Regional  Level of Consciousness: awake, alert , oriented and sedated  Airway & Oxygen Therapy: Patient Spontanous Breathing and Patient connected to nasal cannula oxygen  Post-op Assessment: Report given to RN, Post -op Vital signs reviewed and stable and Patient moving all extremities  Post vital signs: Reviewed and stable    Last Vitals:  Vitals Value Taken Time  BP 139/96 12/30/18 1302  Temp    Pulse 83 12/30/18 1305  Resp 19 12/30/18 1305  SpO2 100 % 12/30/18 1305  Vitals shown include unvalidated device data.  Last Pain:  Vitals:   12/30/18 0826  TempSrc: Oral         Complications: No apparent anesthesia complications

## 2018-12-30 NOTE — Op Note (Signed)
NAME: Jillian Lopez, Jillian Lopez MEDICAL RECORD ER:[id number] ACCOUNT [id number] DATE OF BIRTH:31-Jul-1970 FACILITY: [facility] LOCATION: [facility] PHYSICIAN:GREGORY Randel Pigg, MD  OPERATIVE REPORT  DATE OF PROCEDURE:  12/30/2018  PREOPERATIVE DIAGNOSES:  Left shoulder biceps tendon subluxation with subscapularis tear.  POSTOPERATIVE DIAGNOSES:  Left shoulder biceps tendon subluxation with subscapularis tear.  PROCEDURE:  Left shoulder arthroscopy with biceps tendon release and limited debridement of the superior labrum followed by open biceps tenodesis and subscapularis repair using Arthrex anchors consisting of Endobutton for biceps tenodesis and two 5.5  corkscrews and two 4.5 PushLocks for the subscap repair.  SURGEON:  Meredith Pel, MD  ASSISTANT:  Laure Kidney, RNFA  INDICATIONS:  This is a 48 year old patient with left shoulder pain following an injury several weeks ago.  She presents now for operative management of the biceps tendon subluxation and subscapularis tear.  OPERATIVE FINDINGS: 1.  On examination under anesthesia, the patient had good passive range of motion including about 90 degrees of external rotation at 15 degrees of abduction.  The patient had reasonable shoulder stability to anterior and posterior stress with about  1-1/2+ anterior instability, 1+ posterior instability, and less than a centimeter sulcus sign. 2.  Diagnostic arthroscopy: 3.  Intact glenohumeral articular surfaces. 4.  Intact infraspinatus and supraspinatus attachment. 5.  Tear of the rotator interval and subscapularis off the lesser tuberosity with medial subluxation of the biceps tendon.  PROCEDURE IN DETAIL:  The patient was brought to the operating room where general endotracheal anesthesia was induced.  Preoperative antibiotics were administered.  Timeout was called.  Left shoulder was examined under anesthesia and found to have fairly  minimal instability.  Shoulder was then prescrubbed with alcohol  and Betadine and allowed to air dry.  Prepped with DuraPrep solution and draped in a sterile manner.  Charlie Pitter was used to cover the axilla.  Timeout was called.  Posterior portal created 2  cm medial and inferior to the posterolateral margin of the acromion.  Diagnostic arthroscopy was performed. The patient had medial subluxation of the biceps tendon.  After creation of an anterior portal, the biceps tendon was released.  The Arthrocare  wand was then used to debride the superior labrum.  Following this, the instruments were removed, and the posterior portal was closed using 3-0 nylon.  Ioban then used to cover the entire operative field.  A deltopectoral approach was made.  Skin and  subcutaneous tissue were sharply divided.  Cephalic vein mobilized laterally.  However, a branch avulsed from the cephalic vein, and that had to be tied.  The deltopectoral interval was developed.  The biceps tendon was then identified and tenodesed just  above the pectoralis major tendon.  This was done after placing grasping loop sutures through the tendon itself and then using an Arthrex Endobutton for fixation.  This was supplemented with tenodesis to the pec tendon itself.  This gave a stable  fixation distal to the anticipated location of the PushLock fixation.  At this time, attention was directed towards the subscap.  The patient had good mobility of her subscap.  Vicryl 0 sutures were placed as traction sutures.  The tendon mobilized  nicely.  The lesser tuberosity was then prepared by abrading the bony surface with a 10 scalpel.  This preserved the bone but also created an excellent bony bleeding bed for attachment.  Following this, two 5.5 corkscrew suture anchors were placed at the  articular margin and lesser tuberosity junction.  The suture tapes x8 were then placed  through the tendon at the appropriate amount of tension as well as excursion in order to achieve complete coverage of that lesser tuberosity.  They were  then tied and  then 4 of the suture limbs were taken superiorly, 4 were taken inferiorly, and along with the Vicryl stay sutures, they were incorporated into the bicipital groove above the tenodesis of the biceps tendon with PushLock suture anchors.  This gave a  watertight repair.  Rotator interval was then closed with the arm in external rotation using 0 Vicryl suture.  Thorough irrigation was performed.  The deltopectoral interval was closed using #1 Vicryl suture followed by an interrupted inverted 0 Vicryl  suture, 2-0 Vicryl suture and a 3-0 Monocryl.  The patient tolerated the procedure well without immediate complications and transferred to the recovery room in stable condition.  LN/NUANCE  D:12/30/2018 T:12/30/2018 JOB:[id number]

## 2018-12-30 NOTE — Anesthesia Postprocedure Evaluation (Signed)
Anesthesia Post Note  Patient: Jillian Lopez  Procedure(s) Performed: LEFT SHOULDER ARTHROSCOPY, BICEPS RELEASE, OPEN SUBSCAP REPAIR WITH BICEPS TENODESIS (Left Shoulder)     Patient location during evaluation: PACU Anesthesia Type: Regional and General Level of consciousness: awake and alert Pain management: pain level controlled Vital Signs Assessment: post-procedure vital signs reviewed and stable Respiratory status: spontaneous breathing, nonlabored ventilation, respiratory function stable and patient connected to nasal cannula oxygen Cardiovascular status: blood pressure returned to baseline and stable Postop Assessment: no apparent nausea or vomiting Anesthetic complications: no    Last Vitals:  Vitals:   12/30/18 0925 12/30/18 1302  BP: (!) 161/87 (!) 139/96  Pulse: 88 82  Resp: 16   Temp:    SpO2: 99% 100%    Last Pain:  Vitals:   12/30/18 0826  TempSrc: Oral                 [name]

## 2018-12-30 NOTE — Brief Op Note (Signed)
   12/30/2018  12:56 PM  PATIENT:  Jillian Lopez  48 y.o. female  PRE-OPERATIVE DIAGNOSIS:  left shoulder subcapularis tear and biceps subluxation  POST-OPERATIVE DIAGNOSIS:  left shoulder subcapularis tear and biceps subluxation  PROCEDURE:  Procedure(s): LEFT SHOULDER ARTHROSCOPY limited debridement , BICEPS RELEASE, OPEN SUBSCAP REPAIR WITH BICEPS TENODESIS  SURGEON:  Surgeon(s): Marlou Sa, Tonna Corner, MD  ASSISTANT: Purvis Kilts rnfa ANESTHESIA:   general  EBL: 50 ml    Total I/O In: 800 [I.V.:800] Out: 75 [Blood:75]  BLOOD ADMINISTERED: none  DRAINS: none   LOCAL MEDICATIONS USED:  none  SPECIMEN:  No Specimen  COUNTS:  YES  TOURNIQUET:  * No tourniquets in log *  DICTATION: .Other Dictation: Dictation Number [id number]  PLAN OF CARE: Discharge to home after PACU  PATIENT DISPOSITION:  PACU - hemodynamically stable

## 2018-12-30 NOTE — Anesthesia Procedure Notes (Signed)
Procedure Name: Intubation Date/Time: 12/30/2018 10:13 AM Performed by: Scheryl Darter, CRNA Pre-anesthesia Checklist: Patient identified, Emergency Drugs available, Suction available and Patient being monitored Patient Re-evaluated:Patient Re-evaluated prior to induction Oxygen Delivery Method: Circle System Utilized Preoxygenation: Pre-oxygenation with 100% oxygen Induction Type: IV induction Ventilation: Mask ventilation without difficulty Laryngoscope Size: Miller and 2 Grade View: Grade I Tube type: Oral Tube size: 7.0 mm Number of attempts: 1 Airway Equipment and Method: Stylet and Oral airway Placement Confirmation: ETT inserted through vocal cords under direct vision,  positive ETCO2 and breath sounds checked- equal and bilateral Secured at: 22 cm Tube secured with: Tape Dental Injury: Teeth and Oropharynx as per pre-operative assessment

## 2018-12-30 NOTE — Anesthesia Procedure Notes (Signed)
Anesthesia Regional Block: Interscalene brachial plexus block   Pre-Anesthetic Checklist: ,, timeout performed, Correct Patient, Correct Site, Correct Laterality, Correct Procedure, Correct Position, site marked, Risks and benefits discussed,  Surgical consent,  Pre-op evaluation,  At surgeon's request and post-op pain management  Laterality: Left  Prep: chloraprep       Needles:  Injection technique: Single-shot  Needle Type: Echogenic Stimulator Needle     Needle Length: 5cm  Needle Gauge: 22     Additional Needles:   Procedures:, nerve stimulator,,,,,,,   Nerve Stimulator or Paresthesia:  Response: biceps flexion, 0.45 mA,   Additional Responses:   Narrative:  Start time: 12/30/2018 9:12 AM End time: 12/30/2018 9:18 AM Injection made incrementally with aspirations every 5 mL.  Performed by: Personally  Anesthesiologist: Albertha Ghee, MD  Additional Notes: Functioning IV was confirmed and monitors were applied.  A 19mm 22ga Arrow echogenic stimulator needle was used. Sterile prep and drape,hand hygiene and sterile gloves were used.  Negative aspiration and negative test dose prior to incremental administration of local anesthetic. The patient tolerated the procedure well.  Ultrasound guidance: relevent anatomy identified, needle position confirmed, local anesthetic spread visualized around nerve(s), vascular puncture avoided.  Image printed for medical record.

## 2018-12-30 NOTE — Progress Notes (Signed)
Office Visit Note   Patient: Jillian Lopez           Date of Birth: 03/03/1971           MRN: [id number] Visit Date: 12/26/2018 Requested by: Leamon Arnt, [place],  [place] [address] PCP: Leamon Arnt, MD  Subjective: Chief Complaint  Patient presents with  . Left Shoulder - Pain    HPI: Jillian Lopez is a patient with right shoulder pain.  Since have seen her she has had MRI scan of that left shoulder.  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  She injured the shoulder several days ago.  She had subscapularis weakness on examination last clinic visit.  MRI scan does confirm subscapularis tear with retraction as well as medial subluxation of the biceps.              ROS: All systems reviewed are negative as they relate to the chief complaint within the history of present illness.  Patient denies  fevers or chills.   Assessment & Plan: Visit Diagnoses:  1. Traumatic complete tear of left rotator cuff, initial encounter     Plan: Impression is left shoulder subscapularis tear with medial subluxation of the biceps.  Plan is arthroscopy with biceps tendon release labral debridement and mini open subscapularis repair.  We need to do this relatively soon because of the retraction potential of the subscapularis.  The risk and benefits of surgery are discussed including not limited to infection nerve vessel damage shoulder stiffness as well as potential need for more surgery.  All questions answered  Follow-Up Instructions: Return for after MRI.   Orders:  No orders of the defined types were placed in this encounter.  No orders of the defined types were placed in this encounter.     Procedures: No procedures performed   Clinical Data: No additional findings.  Objective: Vital Signs: There were no vitals taken for this visit.  Physical Exam:   Constitutional: Patient appears well-developed HEENT:  Head: Normocephalic Eyes:EOM are normal Neck: Normal  range of motion Cardiovascular: Normal rate Pulmonary/chest: Effort normal Neurologic: Patient is alert Skin: Skin is warm Psychiatric: Patient has normal mood and affect    Ortho Exam: Ortho exam demonstrates weakness to subscapularis testing but reasonable infraspinatus and supraspinatus strength.  Patient has a fair amount of passive external rotation on the left to about 90 degrees compared about 65 on the right.  Negative apprehension relocation testing.  No discrete AC joint tenderness to direct palpation.  Specialty Comments:  No specialty comments available.  Imaging: No results found.   PMFS History: Patient Active Problem List   Diagnosis Date Noted  . Eczema of both external ears 04/11/2018  . Perimenopausal symptom 04/11/2018  . Cervical disc disorder at C5-C6 level with radiculopathy 09/06/2017  . Essential hypertension 03/02/2016  . Ulcerative pancolitis without complication ([place]) 38/04/1750   Past Medical History:  Diagnosis Date  . Arthritis   . Complication of anesthesia   . GERD (gastroesophageal reflux disease)    at times  . Hypertension   . PONV (postoperative nausea and vomiting)   . Ulcerative pancolitis without complication ([place]) 02/58/5277    Family History  Problem Relation Age of Onset  . Arthritis Mother   . Lung cancer Father        small cell ca  . Hypertension Brother   . Stroke Maternal Grandfather   . Ulcerative colitis Maternal Grandfather   . Melanoma Paternal  Grandmother   . Ovarian cancer Paternal Aunt   . Diabetes Paternal Aunt     Past Surgical History:  Procedure Laterality Date  . CERVICAL FUSION     Cervical 5- 6  . COLONOSCOPY     numerous   Social History   Occupational History  . Occupation: Therapist, sports: [employer]  Tobacco Use  . Smoking status: Former Smoker    Packs/day: 0.15    Years: 10.00    Pack years: 1.50    Types: Cigarettes    Quit date: 07/16/1998     Years since quitting: 20.4  . Smokeless tobacco: Never Used  Substance and Sexual Activity  . Alcohol use: Yes    Alcohol/week: 4.0 standard drinks    Types: 4 Glasses of wine per week  . Drug use: No  . Sexual activity: Yes

## 2018-12-31 ENCOUNTER — Encounter (HOSPITAL_COMMUNITY): Payer: Self-pay | Admitting: Orthopedic Surgery

## 2019-01-07 ENCOUNTER — Encounter: Payer: Self-pay | Admitting: Orthopedic Surgery

## 2019-01-07 ENCOUNTER — Ambulatory Visit (INDEPENDENT_AMBULATORY_CARE_PROVIDER_SITE_OTHER): Payer: BC Managed Care – PPO | Admitting: Orthopedic Surgery

## 2019-01-07 ENCOUNTER — Other Ambulatory Visit: Payer: Self-pay

## 2019-01-07 VITALS — Ht 67.0 in | Wt 200.0 lb

## 2019-01-07 DIAGNOSIS — S46012A Strain of muscle(s) and tendon(s) of the rotator cuff of left shoulder, initial encounter: Secondary | ICD-10-CM

## 2019-01-07 NOTE — Progress Notes (Signed)
Post-Op Visit Note   Patient: Jillian Lopez           Date of Birth: February 27, 1971           MRN: [id number] Visit Date: 01/07/2019 PCP: Leamon Arnt, MD   Assessment & Plan:  Chief Complaint:  Chief Complaint  Patient presents with  . Left Shoulder - Routine Post Op   Visit Diagnoses:  1. Traumatic complete tear of left rotator cuff, initial encounter     Plan: Karina is 48 years old a week out from subscap repair on the left-hand side with biceps tenodesis.  She has been doing well.  She is off narcotic pain medicine.  On exam deltoid is functional she has pretty good passive range of motion to about 20 degrees of external rotation and about 60 of forward flexion abduction.  Incision is intact.  Plan at this time is to continue the sling for 2 more weeks and then we will likely DC the sling.  No active biceps no cuff strengthening but I think passive range of motion to 30 degrees of external rotation is okay at this time based on intraoperative findings.  Also let her do forward flexion abduction about 45 this week and 60 next week.  Come back in 2 weeks for clinical recheck.  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  Follow-Up Instructions: Return in about 2 weeks (around 01/21/2019).   Orders:  No orders of the defined types were placed in this encounter.  No orders of the defined types were placed in this encounter.   Imaging: No results found.  PMFS History: Patient Active Problem List   Diagnosis Date Noted  . Eczema of both external ears 04/11/2018  . Perimenopausal symptom 04/11/2018  . Cervical disc disorder at C5-C6 level with radiculopathy 09/06/2017  . Essential hypertension 03/02/2016  . Ulcerative pancolitis without complication ([place]) 84/13/2440   Past Medical History:  Diagnosis Date  . Arthritis   . Complication of anesthesia   . GERD (gastroesophageal reflux disease)    at times  . Hypertension   . PONV  (postoperative nausea and vomiting)   . Ulcerative pancolitis without complication ([place]) 05/12/2535    Family History  Problem Relation Age of Onset  . Arthritis Mother   . Lung cancer Father        small cell ca  . Hypertension Brother   . Stroke Maternal Grandfather   . Ulcerative colitis Maternal Grandfather   . Melanoma Paternal Grandmother   . Ovarian cancer Paternal Aunt   . Diabetes Paternal Aunt     Past Surgical History:  Procedure Laterality Date  . CERVICAL FUSION     Cervical 5- 6  . COLONOSCOPY     numerous  . SHOULDER ARTHROSCOPY WITH BICEPS TENDON REPAIR Left 12/30/2018   Procedure: LEFT SHOULDER ARTHROSCOPY, BICEPS RELEASE, OPEN SUBSCAP REPAIR WITH BICEPS TENODESIS;  Surgeon: Meredith Pel, MD;  Location: [place] [place];  Service: Orthopedics;  Laterality: Left;   Social History   Occupational History  . Occupation: Therapist, sports: [employer]  Tobacco Use  . Smoking status: Former Smoker    Packs/day: 0.15    Years: 10.00    Pack years: 1.50    Types: Cigarettes    Quit date: 07/16/1998    Years since quitting: 20.4  . Smokeless tobacco: Never Used  Substance and Sexual Activity  .  Alcohol use: Yes    Alcohol/week: 4.0 standard drinks    Types: 4 Glasses of wine per week  . Drug use: No  . Sexual activity: Yes

## 2019-01-14 DIAGNOSIS — Z03818 Encounter for observation for suspected exposure to other biological agents ruled out: Secondary | ICD-10-CM | POA: Diagnosis not present

## 2019-01-21 ENCOUNTER — Encounter: Payer: Self-pay | Admitting: Orthopedic Surgery

## 2019-01-21 ENCOUNTER — Ambulatory Visit (INDEPENDENT_AMBULATORY_CARE_PROVIDER_SITE_OTHER): Payer: BC Managed Care – PPO | Admitting: Orthopedic Surgery

## 2019-01-21 ENCOUNTER — Other Ambulatory Visit: Payer: Self-pay

## 2019-01-21 DIAGNOSIS — S46012A Strain of muscle(s) and tendon(s) of the rotator cuff of left shoulder, initial encounter: Secondary | ICD-10-CM

## 2019-01-21 NOTE — Progress Notes (Signed)
   Post-Op Visit Note   Patient: Jillian Lopez           Date of Birth: 12/25/1970           MRN: [id number] Visit Date: 01/21/2019 PCP: Leamon Arnt, MD   Assessment & Plan:  Chief Complaint:  Chief Complaint  Patient presents with  . Left Shoulder - Follow-up   Visit Diagnoses:  1. Traumatic complete tear of left rotator cuff, initial encounter     Plan: Opal Sidles is now 3 weeks out left shoulder subscap repair and biceps tenodesis.  She is doing well.  She is in a sling.  On exam she has external rotation to about 45.  Good subscap strength.  Passively I can get her fairly easily to 90 degrees of forward flexion abduction.  At this time I will have her discontinue the sling.  Continue with stretching.  Continued just to 45 and 9090 on abduction and forward flexion.  In 3 weeks we will get her strengthening and then going above shoulder level.  Prescription provided.  Follow-Up Instructions: Return in about 3 weeks (around 02/11/2019).   Orders:  No orders of the defined types were placed in this encounter.  No orders of the defined types were placed in this encounter.   Imaging: No results found.  PMFS History: Patient Active Problem List   Diagnosis Date Noted  . Eczema of both external ears 04/11/2018  . Perimenopausal symptom 04/11/2018  . Cervical disc disorder at C5-C6 level with radiculopathy 09/06/2017  . Essential hypertension 03/02/2016  . Ulcerative pancolitis without complication ([place]) 73/66/8159   Past Medical History:  Diagnosis Date  . Arthritis   . Complication of anesthesia   . GERD (gastroesophageal reflux disease)    at times  . Hypertension   . PONV (postoperative nausea and vomiting)   . Ulcerative pancolitis without complication ([place]) 47/01/6150    Family History  Problem Relation Age of Onset  . Arthritis Mother   . Lung cancer Father        small cell ca  . Hypertension Brother   . Stroke Maternal Grandfather   . Ulcerative colitis  Maternal Grandfather   . Melanoma Paternal Grandmother   . Ovarian cancer Paternal Aunt   . Diabetes Paternal Aunt     Past Surgical History:  Procedure Laterality Date  . CERVICAL FUSION     Cervical 5- 6  . COLONOSCOPY     numerous  . SHOULDER ARTHROSCOPY WITH BICEPS TENDON REPAIR Left 12/30/2018   Procedure: LEFT SHOULDER ARTHROSCOPY, BICEPS RELEASE, OPEN SUBSCAP REPAIR WITH BICEPS TENODESIS;  Surgeon: Meredith Pel, MD;  Location: [facility];  Service: Orthopedics;  Laterality: Left;   Social History   Occupational History  . Occupation: Therapist, sports: [employer]  Tobacco Use  . Smoking status: Former Smoker    Packs/day: 0.15    Years: 10.00    Pack years: 1.50    Types: Cigarettes    Quit date: 07/16/1998    Years since quitting: 20.5  . Smokeless tobacco: Never Used  Substance and Sexual Activity  . Alcohol use: Yes    Alcohol/week: 4.0 standard drinks    Types: 4 Glasses of wine per week  . Drug use: No  . Sexual activity: Yes

## 2019-02-09 ENCOUNTER — Ambulatory Visit (INDEPENDENT_AMBULATORY_CARE_PROVIDER_SITE_OTHER): Payer: BC Managed Care – PPO | Admitting: Orthopedic Surgery

## 2019-02-09 ENCOUNTER — Encounter: Payer: Self-pay | Admitting: Orthopedic Surgery

## 2019-02-09 DIAGNOSIS — S46012A Strain of muscle(s) and tendon(s) of the rotator cuff of left shoulder, initial encounter: Secondary | ICD-10-CM

## 2019-02-09 NOTE — Progress Notes (Signed)
   Post-Op Visit Note   Patient: Jillian Lopez           Date of Birth: February 25, 1971           MRN: [id number] Visit Date: 02/09/2019 PCP: Leamon Arnt, MD   Assessment & Plan:  Chief Complaint:  Chief Complaint  Patient presents with  . Left Shoulder - Follow-up   Visit Diagnoses:  1. Traumatic complete tear of left rotator cuff, initial encounter     Plan: Jillian Lopez is a patient is now about 6 weeks out left shoulder arthroscopy with subscap repair.  She is been doing well.  She did push a lawnmower and had some pain with the lawnmower after that.  That was a week ago.  On exam she has external rotation on the right to 90 and on the left to about 70.  There is a good endpoint on that left-hand side.  Subscap strength is about 4+ out of 5.  She does have some forward flexion abduction approaching 90.  At this time I like for her to start gentle strengthening exercises plus overhead motion.  Come back in 4 weeks for clinical recheck.  Follow-Up Instructions: Return in about 4 weeks (around 03/09/2019).   Orders:  No orders of the defined types were placed in this encounter.  No orders of the defined types were placed in this encounter.   Imaging: No results found.  PMFS History: Patient Active Problem List   Diagnosis Date Noted  . Eczema of both external ears 04/11/2018  . Perimenopausal symptom 04/11/2018  . Cervical disc disorder at C5-C6 level with radiculopathy 09/06/2017  . Essential hypertension 03/02/2016  . Ulcerative pancolitis without complication ([facility]) 33/38/3291   Past Medical History:  Diagnosis Date  . Arthritis   . Complication of anesthesia   . GERD (gastroesophageal reflux disease)    at times  . Hypertension   . PONV (postoperative nausea and vomiting)   . Ulcerative pancolitis without complication ([facility]) 91/66/0600    Family History  Problem Relation Age of Onset  . Arthritis Mother   . Lung cancer Father        small cell ca  . Hypertension  Brother   . Stroke Maternal Grandfather   . Ulcerative colitis Maternal Grandfather   . Melanoma Paternal Grandmother   . Ovarian cancer Paternal Aunt   . Diabetes Paternal Aunt     Past Surgical History:  Procedure Laterality Date  . CERVICAL FUSION     Cervical 5- 6  . COLONOSCOPY     numerous  . SHOULDER ARTHROSCOPY WITH BICEPS TENDON REPAIR Left 12/30/2018   Procedure: LEFT SHOULDER ARTHROSCOPY, BICEPS RELEASE, OPEN SUBSCAP REPAIR WITH BICEPS TENODESIS;  Surgeon: Meredith Pel, MD;  Location: [facility];  Service: Orthopedics;  Laterality: Left;   Social History   Occupational History  . 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 Smoking status: Former Smoker    Packs/day: 0.15    Years: 10.00    Pack years: 1.50    Types: Cigarettes    Quit date: 07/16/1998    Years since quitting: 20.5  . Smokeless tobacco: Never Used  Substance and Sexual Activity  . Alcohol use: Yes    Alcohol/week: 4.0 standard drinks    Types: 4 Glasses of wine per week  . Drug use: No  . Sexual activity: Yes

## 2019-02-11 ENCOUNTER — Ambulatory Visit: Payer: BC Managed Care – PPO | Admitting: Orthopedic Surgery

## 2019-03-09 ENCOUNTER — Encounter: Payer: Self-pay | Admitting: Orthopedic Surgery

## 2019-03-09 ENCOUNTER — Ambulatory Visit (INDEPENDENT_AMBULATORY_CARE_PROVIDER_SITE_OTHER): Payer: BC Managed Care – PPO | Admitting: Orthopedic Surgery

## 2019-03-09 DIAGNOSIS — S46012A Strain of muscle(s) and tendon(s) of the rotator cuff of left shoulder, initial encounter: Secondary | ICD-10-CM

## 2019-03-11 ENCOUNTER — Encounter: Payer: Self-pay | Admitting: Orthopedic Surgery

## 2019-03-11 NOTE — Progress Notes (Signed)
Post-Op Visit Note   Patient: Jillian Lopez           Date of Birth: [birth year]           MRN: [id number] Visit Date: 03/09/2019 PCP: Leamon Arnt, MD   Assessment & Plan:  Chief Complaint:  Chief Complaint  Patient presents with  . Left Shoulder - Routine Post Op   Visit Diagnoses: No diagnosis found.  Plan: Braelee is a patient who is now about 9 weeks out left shoulder subscap repair.  She is actually been doing well.  On exam she has good strength and appropriate check rein at the end of range of motion of about 60 degrees of external rotation on the left-hand side.  On the right she is got about 85 of external rotation.  Subscap strength is still slightly weaker on the left than the right but it is improving.  Range of motion is excellent.  Plan at this time is to continue doing strengthening but at the current level.  I do not want her to try to get too strong to quit.  After about another 4 to 6 weeks I think she can progress.  She did do some kayaking in the [place]  I will see her back as needed.  Follow-Up Instructions: Return if symptoms worsen or fail to improve.   Orders:  No orders of the defined types were placed in this encounter.  No orders of the defined types were placed in this encounter.   Imaging: No results found.  PMFS History: Patient Active Problem List   Diagnosis Date Noted  . Eczema of both external ears 04/11/2018  . Perimenopausal symptom 04/11/2018  . Cervical disc disorder at C5-C6 level with radiculopathy 09/06/2017  . Essential hypertension 03/02/2016  . Ulcerative pancolitis without complication ([facility]) 45/62/5638   Past Medical History:  Diagnosis Date  . Arthritis   . Complication of anesthesia   . GERD (gastroesophageal reflux disease)    at times  . Hypertension   . PONV (postoperative nausea and vomiting)   . Ulcerative pancolitis without complication ([facility]) 93/73/4287     Family History  Problem Relation Age of Onset  . Arthritis Mother   . Lung cancer Father        small cell ca  . Hypertension Brother   . Stroke Maternal Grandfather   . Ulcerative colitis Maternal Grandfather   . Melanoma Paternal Grandmother   . Ovarian cancer Paternal Aunt   . Diabetes Paternal Aunt     Past Surgical History:  Procedure Laterality Date  . CERVICAL FUSION     Cervical 5- 6  . COLONOSCOPY     numerous  . SHOULDER ARTHROSCOPY WITH BICEPS TENDON REPAIR Left 12/30/2018   Procedure: LEFT SHOULDER ARTHROSCOPY, BICEPS RELEASE, OPEN SUBSCAP REPAIR WITH BICEPS TENODESIS;  Surgeon: Meredith Pel, MD;  Location: [facility];  Service: Orthopedics;  Laterality: Left;   Social History   Occupational History  . 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 Smoking status: Former Smoker    Packs/day: 0.15    Years: 10.00    Pack years: 1.50    Types: Cigarettes    Quit date: 07/16/1998    Years since quitting: 20.6  . Smokeless tobacco: Never Used  Substance and Sexual Activity  . Alcohol use: Yes    Alcohol/week:  4.0 standard drinks    Types: 4 Glasses of wine per week  . Drug use: No  . Sexual activity: Yes

## 2019-03-16 ENCOUNTER — Other Ambulatory Visit: Payer: Self-pay | Admitting: Family Medicine

## 2019-03-16 NOTE — Telephone Encounter (Signed)
Pt has f/u appt 9/18 scheduled.

## 2019-04-03 ENCOUNTER — Encounter: Payer: BC Managed Care – PPO | Admitting: Family Medicine

## 2019-04-17 ENCOUNTER — Encounter: Payer: Self-pay | Admitting: Family Medicine

## 2019-04-17 ENCOUNTER — Other Ambulatory Visit: Payer: Self-pay

## 2019-04-17 ENCOUNTER — Ambulatory Visit (INDEPENDENT_AMBULATORY_CARE_PROVIDER_SITE_OTHER): Payer: BC Managed Care – PPO | Admitting: Family Medicine

## 2019-04-17 VITALS — BP 122/88 | HR 61 | Temp 98.0°F | Resp 16 | Ht 67.0 in | Wt 200.0 lb

## 2019-04-17 DIAGNOSIS — I10 Essential (primary) hypertension: Secondary | ICD-10-CM

## 2019-04-17 DIAGNOSIS — K5100 Ulcerative (chronic) pancolitis without complications: Secondary | ICD-10-CM

## 2019-04-17 DIAGNOSIS — R9431 Abnormal electrocardiogram [ECG] [EKG]: Secondary | ICD-10-CM

## 2019-04-17 DIAGNOSIS — R072 Precordial pain: Secondary | ICD-10-CM

## 2019-04-17 DIAGNOSIS — Z23 Encounter for immunization: Secondary | ICD-10-CM | POA: Diagnosis not present

## 2019-04-17 DIAGNOSIS — Z0000 Encounter for general adult medical examination without abnormal findings: Secondary | ICD-10-CM

## 2019-04-17 LAB — CBC WITH DIFFERENTIAL/PLATELET
Basophils Absolute: 0.1 10*3/uL (ref 0.0–0.1)
Basophils Relative: 0.7 % (ref 0.0–3.0)
Eosinophils Absolute: 0.3 10*3/uL (ref 0.0–0.7)
Eosinophils Relative: 3.3 % (ref 0.0–5.0)
HCT: 41.3 % (ref 36.0–46.0)
Hemoglobin: 13.9 g/dL (ref 12.0–15.0)
Lymphocytes Relative: 23.3 % (ref 12.0–46.0)
Lymphs Abs: 2.1 10*3/uL (ref 0.7–4.0)
MCHC: 33.7 g/dL (ref 30.0–36.0)
MCV: 97.6 fl (ref 78.0–100.0)
Monocytes Absolute: 0.4 10*3/uL (ref 0.1–1.0)
Monocytes Relative: 4.1 % (ref 3.0–12.0)
Neutro Abs: 6.1 10*3/uL (ref 1.4–7.7)
Neutrophils Relative %: 68.6 % (ref 43.0–77.0)
Platelets: 365 10*3/uL (ref 150.0–400.0)
RBC: 4.24 Mil/uL (ref 3.87–5.11)
RDW: 13.5 % (ref 11.5–15.5)
WBC: 8.8 10*3/uL (ref 4.0–10.5)

## 2019-04-17 LAB — LIPID PANEL
Cholesterol: 197 mg/dL (ref 0–200)
HDL: 38.8 mg/dL — ABNORMAL LOW (ref >39.00)
NonHDL: 158.39
Total CHOL/HDL Ratio: 5
Triglycerides: 209 mg/dL — ABNORMAL HIGH (ref 0.0–149.0)
VLDL: 41.8 mg/dL — ABNORMAL HIGH (ref 0.0–40.0)

## 2019-04-17 LAB — COMPREHENSIVE METABOLIC PANEL
ALT: 14 U/L (ref 0–35)
AST: 12 U/L (ref 0–37)
Albumin: 4.4 g/dL (ref 3.5–5.2)
Alkaline Phosphatase: 48 U/L (ref 39–117)
BUN: 13 mg/dL (ref 6–23)
CO2: 24 mEq/L (ref 19–32)
Calcium: 9.6 mg/dL (ref 8.4–10.5)
Chloride: 103 mEq/L (ref 96–112)
Creatinine, Ser: 0.78 mg/dL (ref 0.40–1.20)
GFR: 78.21 mL/min (ref >60.00)
Glucose, Bld: 86 mg/dL (ref 70–99)
Potassium: 4.2 mEq/L (ref 3.5–5.1)
Sodium: 138 mEq/L (ref 135–145)
Total Bilirubin: 0.6 mg/dL (ref 0.2–1.2)
Total Protein: 7.7 g/dL (ref 6.0–8.3)

## 2019-04-17 LAB — LDL CHOLESTEROL, DIRECT: Direct LDL: 134 mg/dL

## 2019-04-17 LAB — TSH: TSH: 2.17 u[IU]/mL (ref 0.35–4.50)

## 2019-04-17 MED ORDER — LISINOPRIL-HYDROCHLOROTHIAZIDE 10-12.5 MG PO TABS: 1.0000 | ORAL_TABLET | Freq: Every day | ORAL | 3 refills | Status: DC

## 2019-04-17 NOTE — Patient Instructions (Signed)
Please return in 3 months for follow up of your hypertension.  I will release your lab results to you on your MyChart account with further instructions. Please reply with any questions.  Today you were given your flu vaccination.    If you have any questions or concerns, please don't hesitate to send me a message via MyChart or call the office at [phone]. Thank you for visiting with Jillian Lopez today! It's our pleasure caring for you.   Preventive Care 50-54 Years Old, Female Preventive care refers to visits with your health care provider and lifestyle choices that can promote health and wellness. This includes:  A yearly physical exam. This may also be called an annual well check.  Regular dental visits and eye exams.  Immunizations.  Screening for certain conditions.  Healthy lifestyle choices, such as eating a healthy diet, getting regular exercise, not using drugs or products that contain nicotine and tobacco, and limiting alcohol use. What can I expect for my preventive care visit? Physical exam Your health care provider will check your:  Height and weight. This may be used to calculate body mass index (BMI), which tells if you are at a healthy weight.  Heart rate and blood pressure.  Skin for abnormal spots. Counseling Your health care provider may ask you questions about your:  Alcohol, tobacco, and drug use.  Emotional well-being.  Home and relationship well-being.  Sexual activity.  Eating habits.  Work and work Statistician.  Method of birth control.  Menstrual cycle.  Pregnancy history. What immunizations do I need?  Influenza (flu) vaccine  This is recommended every year. Tetanus, diphtheria, and pertussis (Tdap) vaccine  You may need a Td booster every 10 years. Varicella (chickenpox) vaccine  You may need this if you have not been vaccinated. Zoster (shingles) vaccine  You may need this after age 15. Measles, mumps, and rubella (MMR) vaccine   You may need at least one dose of MMR if you were born in 1957 or later. You may also need a second dose. Pneumococcal conjugate (PCV13) vaccine  You may need this if you have certain conditions and were not previously vaccinated. Pneumococcal polysaccharide (PPSV23) vaccine  You may need one or two doses if you smoke cigarettes or if you have certain conditions. Meningococcal conjugate (MenACWY) vaccine  You may need this if you have certain conditions. Hepatitis A vaccine  You may need this if you have certain conditions or if you travel or work in places where you may be exposed to hepatitis A. Hepatitis B vaccine  You may need this if you have certain conditions or if you travel or work in places where you may be exposed to hepatitis B. Haemophilus influenzae type b (Hib) vaccine  You may need this if you have certain conditions. Human papillomavirus (HPV) vaccine  If recommended by your health care provider, you may need three doses over 6 months. You may receive vaccines as individual doses or as more than one vaccine together in one shot (combination vaccines). Talk with your health care provider about the risks and benefits of combination vaccines. What tests do I need? Blood tests  Lipid and cholesterol levels. These may be checked every 5 years, or more frequently if you are over 45 years old.  Hepatitis C test.  Hepatitis B test. Screening  Lung cancer screening. You may have this screening every year starting at age 7 if you have a 30-pack-year history of smoking and currently smoke or have quit within  the past 15 years.  Colorectal cancer screening. All adults should have this screening starting at age 83 and continuing until age 31. Your health care provider may recommend screening at age 103 if you are at increased risk. You will have tests every 1-10 years, depending on your results and the type of screening test.  Diabetes screening. This is done by checking your  blood sugar (glucose) after you have not eaten for a while (fasting). You may have this done every 1-3 years.  Mammogram. This may be done every 1-2 years. Talk with your health care provider about when you should start having regular mammograms. This may depend on whether you have a family history of breast cancer.  BRCA-related cancer screening. This may be done if you have a family history of breast, ovarian, tubal, or peritoneal cancers.  Pelvic exam and Pap test. This may be done every 3 years starting at age 85. Starting at age 19, this may be done every 5 years if you have a Pap test in combination with an HPV test. Other tests  Sexually transmitted disease (STD) testing.  Bone density scan. This is done to screen for osteoporosis. You may have this scan if you are at high risk for osteoporosis. Follow these instructions at home: Eating and drinking  Eat a diet that includes fresh fruits and vegetables, whole grains, lean protein, and low-fat dairy.  Take vitamin and mineral supplements as recommended by your health care provider.  Do not drink alcohol if: ? Your health care provider tells you not to drink. ? You are pregnant, may be pregnant, or are planning to become pregnant.  If you drink alcohol: ? Limit how much you have to 0-1 drink a day. ? Be aware of how much alcohol is in your drink. In the [place] one drink equals one 12 oz bottle of beer (355 mL), one 5 oz glass of wine (148 mL), or one 1 oz glass of hard liquor (44 mL). Lifestyle  Take daily care of your teeth and gums.  Stay active. Exercise for at least 30 minutes on 5 or more days each week.  Do not use any products that contain nicotine or tobacco, such as cigarettes, e-cigarettes, and chewing tobacco. If you need help quitting, ask your health care provider.  If you are sexually active, practice safe sex. Use a condom or other form of birth control (contraception) in order to prevent pregnancy and STIs  (sexually transmitted infections).  If told by your health care provider, take low-dose aspirin daily starting at age 31. What's next?  Visit your health care provider once a year for a well check visit.  Ask your health care provider how often you should have your eyes and teeth checked.  Stay up to date on all vaccines. This information is not intended to replace advice given to you by your health care provider. Make sure you discuss any questions you have with your health care provider. Document Released: 07/29/2015 Document Revised: 03/13/2018 Document Reviewed: 03/13/2018 Elsevier Patient Education  2020 [employer].

## 2019-04-17 NOTE — Progress Notes (Signed)
Subjective  Chief Complaint  Patient presents with  . Annual Exam    Fasting  . Hypertension    HPI: Jillian Lopez is a 48 y.o. female who presents to [facility] [place] today for a Female Wellness Visit. She also has the concerns and/or needs as listed above in the chief complaint. These will be addressed in addition to the Health Maintenance Visit.   Wellness Visit: annual visit with health maintenance review and exam without Pap   HM: due for mammo in December. Had colonoscopy 06/2018 (serial due to UC) and was ok by report. Feels well mostly. Hasn't been exercising. Diet is fair. Happy.  Chronic disease f/u and/or acute problem visit: (deemed necessary to be done in addition to the wellness visit):  Hypertension f/u: Control is fair. Was very elevated back in June pre-surgery. At her office, pt reports diastolics run high. Pt reports she is doing well. taking medications as instructed, no medication side effects noted, no TIAs, no chest pain on exertion, no dyspnea on exertion, no swelling of ankles. However, she had a preop EKG that again showed a septal infarct. She reports she gets intermittent substernal "twinges" at work. Feels heaviness or pressure. Not sharp pain. Doesn't radiate. Doesn't get sob. But it happens on occasion and it is noticeable. No LE edema. No sxs of chf.  She denies adverse effects from his BP medications. Compliance with medication is good.   Eczema and "eye allergies" - red itching eyes; rubs them often and now with flaking red rash intermittently on eye lids and corners of eyes.   UC: well controlled.   BP Readings from Last 3 Encounters:  04/17/19 122/88  12/30/18 (!) 152/101  04/11/18 138/80   Wt Readings from Last 3 Encounters:  04/17/19 200 lb (90.7 kg)  01/07/19 199 lb 15.4 oz (90.7 kg)  12/30/18 199 lb 15.3 oz (90.7 kg)    Lab Results  Component Value Date   CHOL 182 04/11/2018   Lab Results  Component Value Date   HDL 37.40 (L) 04/11/2018   Lab Results  Component Value Date   LDLCALC 115 (H) 04/11/2018   Lab Results  Component Value Date   TRIG 147.0 04/11/2018   Lab Results  Component Value Date   CHOLHDL 5 04/11/2018   No results found for: LDLDIRECT Lab Results  Component Value Date   CREATININE 0.89 12/30/2018   BUN 15 12/30/2018   NA 140 12/30/2018   K 4.3 12/30/2018   CL 110 12/30/2018   CO2 14 (L) 12/30/2018    The 10-year ASCVD risk score Mikey Bussing DC Jr., et al., 2013) is: 2%   Values used to calculate the score:     Age: 72 years     Sex: Female     Is Non-Hispanic African American: No     Diabetic: No     Tobacco smoker: No     Systolic Blood Pressure: 130 mmHg     Is BP treated: Yes     HDL Cholesterol: 37.4 mg/dL     Total Cholesterol: 182 mg/dL  Assessment  1. Annual physical exam   2. Essential hypertension   3. Ulcerative pancolitis without complication ([place])   4. Abnormal EKG   5. Substernal chest pain      Plan  Female Wellness Visit:  Age appropriate Health Maintenance and Prevention measures were discussed with patient. Included topics are cancer screening recommendations, ways to keep healthy (see AVS) including dietary  and exercise recommendations, regular eye and dental care, use of seat belts, and avoidance of moderate alcohol use and tobacco use. mammo in december  BMI: discussed patient's BMI and encouraged positive lifestyle modifications to help get to or maintain a target BMI.  HM needs and immunizations were addressed and ordered. See below for orders. See HM and immunization section for updates. Flu shot today  Routine labs and screening tests ordered including cmp, cbc and lipids where appropriate.  Discussed recommendations regarding Vit D and calcium supplementation (see AVS)  Chronic disease management visit and/or acute problem visit:  HTN: marginal control. Change to lisinopril hct and recheck in 3 months. Check renal function and  electrolytes.  abnl EKG with substernal cp: stress echocardiogram.   UC: well controlled.   Discussed allergic conjunctivitis and mechanical irritation +/- makeup allergies. pataday   Follow up: Return in about 3 months (around 07/18/2019) for follow up Hypertension.  Orders Placed This Encounter  Procedures  . CBC with Differential/Platelet  . Comprehensive metabolic panel  . Lipid panel  . TSH  . ECHOCARDIOGRAM STRESS TEST   Meds ordered this encounter  Medications  . lisinopril-hydrochlorothiazide (ZESTORETIC) 10-12.5 MG tablet    Sig: Take 1 tablet by mouth daily.    Dispense:  90 tablet    Refill:  3      Lifestyle: Body mass index is 31.32 kg/m. Wt Readings from Last 3 Encounters:  04/17/19 200 lb (90.7 kg)  01/07/19 199 lb 15.4 oz (90.7 kg)  12/30/18 199 lb 15.3 oz (90.7 kg)    Patient Active Problem List   Diagnosis Date Noted  . Eczema of both external ears 04/11/2018  . Perimenopausal symptom 04/11/2018    Irritability, hot flushes, decreased energy 2019; menses are regular but flow is changing.   . Cervical disc disorder at C5-C6 level with radiculopathy 09/06/2017  . Essential hypertension 03/02/2016  . Ulcerative pancolitis without complication ([place]) 59/16/3846   Health Maintenance  Topic Date Due  . HIV Screening  05/26/1984  . INFLUENZA VACCINE  02/14/2019  . MAMMOGRAM  07/03/2019  . PAP SMEAR-Modifier  01/12/2021  . COLONOSCOPY  06/15/2021  . TETANUS/TDAP  11/11/2021   Immunization History  Administered Date(s) Administered  . Influenza,inj,Quad PF,6+ Mos 06/26/2016, 04/11/2018  . Influenza-Unspecified 04/15/2017  . Tdap 11/12/2011   We updated and reviewed the patient's past history in detail and it is documented below. Allergies: Patient has No Known Allergies. Past Medical History Patient  has a past medical history of Arthritis, Complication of anesthesia, GERD (gastroesophageal reflux disease), Hypertension, PONV (postoperative nausea  and vomiting), and Ulcerative pancolitis without complication ([place]) (65/99/3570). Past Surgical History Patient  has a past surgical history that includes Cervical fusion; Colonoscopy; and Shoulder arthroscopy with biceps tendon repair (Left, 12/30/2018). Family History: Patient family history includes Arthritis in her mother; Diabetes in her paternal aunt; Hypertension in her brother; Lung cancer in her father; Melanoma in her paternal grandmother; Ovarian cancer in her paternal aunt; Stroke in her maternal grandfather; Ulcerative colitis in her maternal grandfather. Social History:  Patient  reports that she quit smoking about 20 years ago. Her smoking use included cigarettes. She has a 1.50 pack-year smoking history. She has never used smokeless tobacco. She reports current alcohol use of about 4.0 standard drinks of alcohol per week. She reports that she does not use drugs.  Review of Systems: Constitutional: negative for fever or malaise Ophthalmic: negative for photophobia, double vision or loss of vision Cardiovascular: +for chest pain,No  dyspnea on exertion, or new LE swelling Respiratory: negative for SOB or persistent cough Gastrointestinal: negative for abdominal pain, change in bowel habits or melena Genitourinary: negative for dysuria or gross hematuria, no abnormal uterine bleeding or disharge Musculoskeletal: negative for new gait disturbance or muscular weakness Integumentary: no breast lumps Neurological: negative for TIA or stroke symptoms Psychiatric: negative for SI or delusions Allergic/Immunologic: negative for hives  Patient Care Team    Relationship Specialty Notifications Start End  Leamon Arnt, MD PCP - General Family Medicine  09/06/17   Juanita Craver, MD Consulting Physician Gastroenterology  09/06/17   Kary Kos, MD Consulting Physician Neurosurgery  09/06/17     Objective  Vitals: BP 122/88   Pulse 61   Temp 98 F (36.7 C) (Tympanic)   Resp 16   Ht 5'  7" (1.702 m)   Wt 200 lb (90.7 kg)   LMP 04/06/2019   SpO2 100%   BMI 31.32 kg/m  General:  Well developed, well nourished, no acute distress  Psych:  Alert and orientedx3,normal mood and affect HEENT:  Normocephalic, atraumatic, non-icteric sclera, PERRL, oropharynx is clear without mass or exudate, supple neck without adenopathy, mass or thyromegaly Cardiovascular:  Normal S1, S2, RRR without gallop, rub or murmur, nondisplaced PMI Respiratory:  Good breath sounds bilaterally, CTAB with normal respiratory effort Gastrointestinal: normal bowel sounds, soft, non-tender, no noted masses. No HSM MSK: no deformities, contusions. Joints are without erythema or swelling. Spine and CVA region are nontender Skin:  Warm, mild redness on eye lids, no suspicious lesions noted Neurologic:    Mental status is normal. CN 2-11 are normal. Gross motor and sensory exams are normal. Normal gait. No tremor Breast Exam: No mass, skin retraction or nipple discharge is appreciated in either breast. No axillary adenopathy. Fibrocystic changes are not noted  Reviewed ekgs: 2015, 2019 and 2020: normal 2015; new q in v1-v3 in 2019 and 2020.   Commons side effects, risks, benefits, and alternatives for medications and treatment plan prescribed today were discussed, and the patient expressed understanding of the given instructions. Patient is instructed to call or message via MyChart if he/she has any questions or concerns regarding our treatment plan. No barriers to understanding were identified. We discussed Red Flag symptoms and signs in detail. Patient expressed understanding regarding what to do in case of urgent or emergency type symptoms.   Medication list was reconciled, printed and provided to the patient in AVS. Patient instructions and summary information was reviewed with the patient as documented in the AVS. This note was prepared with assistance of Dragon voice recognition software. Occasional wrong-word or  sound-a-like substitutions may have occurred due to the inherent limitations of voice recognition software

## 2019-05-13 ENCOUNTER — Telehealth (HOSPITAL_COMMUNITY): Payer: Self-pay | Admitting: *Deleted

## 2019-05-13 NOTE — Telephone Encounter (Signed)
Left message on voicemail per DPR in reference to upcoming appointment scheduled on 11/02 at 2020 at 1400 with detailed instructions given per Stress Test Requisition Sheet for the test. LM to arrive 30 minutes early, and that it is imperative to arrive on time for appointment to keep from having the test rescheduled. If you need to cancel or reschedule your appointment, please call the office within 24 hours of your appointment. Failure to do so may result in a cancellation of your appointment, and a $50 no show fee. Phone number given for call back for any questions. Noris Kulinski, Ranae Palms

## 2019-05-14 ENCOUNTER — Inpatient Hospital Stay (HOSPITAL_COMMUNITY)
Admission: RE | Admit: 2019-05-14 | Discharge: 2019-05-14 | Disposition: A | Discharge status: Home / Self Care | Payer: BC Managed Care – PPO | Source: Ambulatory Visit

## 2019-05-14 NOTE — Progress Notes (Signed)
Attempted to call patient to inform that [facility] covid site has closed due to inclement weather. Left voicemail to call back to reschedule for 10/30 or 10/31. 

## 2019-05-15 ENCOUNTER — Other Ambulatory Visit (HOSPITAL_COMMUNITY)
Admission: RE | Admit: 2019-05-15 | Discharge: 2019-05-15 | Disposition: A | Discharge status: Home / Self Care | Payer: BC Managed Care – PPO | Source: Ambulatory Visit | Attending: Family Medicine | Admitting: Family Medicine

## 2019-05-15 DIAGNOSIS — Z01812 Encounter for preprocedural laboratory examination: Secondary | ICD-10-CM | POA: Diagnosis not present

## 2019-05-15 DIAGNOSIS — Z20828 Contact with and (suspected) exposure to other viral communicable diseases: Secondary | ICD-10-CM | POA: Insufficient documentation

## 2019-05-16 LAB — NOVEL CORONAVIRUS, NAA (HOSP ORDER, SEND-OUT TO REF LAB; TAT 18-24 HRS): SARS-CoV-2, NAA: NOT DETECTED

## 2019-05-18 ENCOUNTER — Other Ambulatory Visit: Payer: Self-pay

## 2019-05-18 ENCOUNTER — Ambulatory Visit (HOSPITAL_COMMUNITY): Payer: BC Managed Care – PPO

## 2019-05-18 ENCOUNTER — Ambulatory Visit (HOSPITAL_COMMUNITY): Payer: BC Managed Care – PPO | Attending: Internal Medicine

## 2019-05-18 DIAGNOSIS — R9431 Abnormal electrocardiogram [ECG] [EKG]: Secondary | ICD-10-CM | POA: Diagnosis not present

## 2019-05-18 DIAGNOSIS — R072 Precordial pain: Secondary | ICD-10-CM

## 2019-07-24 ENCOUNTER — Ambulatory Visit (INDEPENDENT_AMBULATORY_CARE_PROVIDER_SITE_OTHER): Payer: BC Managed Care – PPO | Admitting: Family Medicine

## 2019-07-24 ENCOUNTER — Other Ambulatory Visit: Payer: Self-pay

## 2019-07-24 ENCOUNTER — Encounter: Payer: Self-pay | Admitting: Family Medicine

## 2019-07-24 VITALS — BP 156/98 | HR 76 | Temp 97.9°F | Ht 67.0 in | Wt 202.4 lb

## 2019-07-24 DIAGNOSIS — I10 Essential (primary) hypertension: Secondary | ICD-10-CM | POA: Diagnosis not present

## 2019-07-24 DIAGNOSIS — K51911 Ulcerative colitis, unspecified with rectal bleeding: Secondary | ICD-10-CM | POA: Diagnosis not present

## 2019-07-24 LAB — BASIC METABOLIC PANEL
BUN: 15 mg/dL (ref 6–23)
CO2: 26 mEq/L (ref 19–32)
Calcium: 9.4 mg/dL (ref 8.4–10.5)
Chloride: 103 mEq/L (ref 96–112)
Creatinine, Ser: 0.79 mg/dL (ref 0.40–1.20)
GFR: 76.99 mL/min (ref >60.00)
Glucose, Bld: 90 mg/dL (ref 70–99)
Potassium: 4.3 mEq/L (ref 3.5–5.1)
Sodium: 136 mEq/L (ref 135–145)

## 2019-07-24 NOTE — Patient Instructions (Addendum)
Please return in October for your annual complete physical and blood pressure check; please come fasting. Please check your bp at home and send me the readings on Mychart over the next 1-2 weeks. If it remains above goal, I will adjust your medication dose.   Please schedule your mammogram.  If you have any questions or concerns, please don't hesitate to send me a message via MyChart or call the office at [phone]. Thank you for visiting with Jillian Lopez today! It's our pleasure caring for you.   Hypertension, Adult High blood pressure (hypertension) is when the force of blood pumping through the arteries is too strong. The arteries are the blood vessels that carry blood from the heart throughout the body. Hypertension forces the heart to work harder to pump blood and may cause arteries to become narrow or stiff. Untreated or uncontrolled hypertension can cause a heart attack, heart failure, a stroke, kidney disease, and other problems. A blood pressure reading consists of a higher number over a lower number. Ideally, your blood pressure should be below 120/80. The first ("top") number is called the systolic pressure. It is a measure of the pressure in your arteries as your heart beats. The second ("bottom") number is called the diastolic pressure. It is a measure of the pressure in your arteries as the heart relaxes. What are the causes? The exact cause of this condition is not known. There are some conditions that result in or are related to high blood pressure. What increases the risk? Some risk factors for high blood pressure are under your control. The following factors may make you more likely to develop this condition:  Smoking.  Having type 2 diabetes mellitus, high cholesterol, or both.  Not getting enough exercise or physical activity.  Being overweight.  Having too much fat, sugar, calories, or salt (sodium) in your diet.  Drinking too much alcohol. Some risk factors for high blood  pressure may be difficult or impossible to change. Some of these factors include:  Having chronic kidney disease.  Having a family history of high blood pressure.  Age. Risk increases with age.  Race. You may be at higher risk if you are African American.  Gender. Men are at higher risk than women before age 72. After age 89, women are at higher risk than men.  Having obstructive sleep apnea.  Stress. What are the signs or symptoms? High blood pressure may not cause symptoms. Very high blood pressure (hypertensive crisis) may cause:  Headache.  Anxiety.  Shortness of breath.  Nosebleed.  Nausea and vomiting.  Vision changes.  Severe chest pain.  Seizures. How is this diagnosed? This condition is diagnosed by measuring your blood pressure while you are seated, with your arm resting on a flat surface, your legs uncrossed, and your feet flat on the floor. The cuff of the blood pressure monitor will be placed directly against the skin of your upper arm at the level of your heart. It should be measured at least twice using the same arm. Certain conditions can cause a difference in blood pressure between your right and left arms. Certain factors can cause blood pressure readings to be lower or higher than normal for a short period of time:  When your blood pressure is higher when you are in a health care provider's office than when you are at home, this is called white coat hypertension. Most people with this condition do not need medicines.  When your blood pressure is higher at home  than when you are in a health care provider's office, this is called masked hypertension. Most people with this condition may need medicines to control blood pressure. If you have a high blood pressure reading during one visit or you have normal blood pressure with other risk factors, you may be asked to:  Return on a different day to have your blood pressure checked again.  Monitor your blood  pressure at home for 1 week or longer. If you are diagnosed with hypertension, you may have other blood or imaging tests to help your health care provider understand your overall risk for other conditions. How is this treated? This condition is treated by making healthy lifestyle changes, such as eating healthy foods, exercising more, and reducing your alcohol intake. Your health care provider may prescribe medicine if lifestyle changes are not enough to get your blood pressure under control, and if:  Your systolic blood pressure is above 130.  Your diastolic blood pressure is above 80. Your personal target blood pressure may vary depending on your medical conditions, your age, and other factors. Follow these instructions at home: Eating and drinking   Eat a diet that is high in fiber and potassium, and low in sodium, added sugar, and fat. An example eating plan is called the DASH (Dietary Approaches to Stop Hypertension) diet. To eat this way: ? Eat plenty of fresh fruits and vegetables. Try to fill one half of your plate at each meal with fruits and vegetables. ? Eat whole grains, such as whole-wheat pasta, brown rice, or whole-grain bread. Fill about one fourth of your plate with whole grains. ? Eat or drink low-fat dairy products, such as skim milk or low-fat yogurt. ? Avoid fatty cuts of meat, processed or cured meats, and poultry with skin. Fill about one fourth of your plate with lean proteins, such as fish, chicken without skin, beans, eggs, or tofu. ? Avoid pre-made and processed foods. These tend to be higher in sodium, added sugar, and fat.  Reduce your daily sodium intake. Most people with hypertension should eat less than 1,500 mg of sodium a day.  Do not drink alcohol if: ? Your health care provider tells you not to drink. ? You are pregnant, may be pregnant, or are planning to become pregnant.  If you drink alcohol: ? Limit how much you use to:  0-1 drink a day for  women.  0-2 drinks a day for men. ? Be aware of how much alcohol is in your drink. In the [place] one drink equals one 12 oz bottle of beer (355 mL), one 5 oz glass of wine (148 mL), or one 1 oz glass of hard liquor (44 mL). Lifestyle   Work with your health care provider to maintain a healthy body weight or to lose weight. Ask what an ideal weight is for you.  Get at least 30 minutes of exercise most days of the week. Activities may include walking, swimming, or biking.  Include exercise to strengthen your muscles (resistance exercise), such as Pilates or lifting weights, as part of your weekly exercise routine. Try to do these types of exercises for 30 minutes at least 3 days a week.  Do not use any products that contain nicotine or tobacco, such as cigarettes, e-cigarettes, and chewing tobacco. If you need help quitting, ask your health care provider.  Monitor your blood pressure at home as told by your health care provider.  Keep all follow-up visits as told by your health care  provider. This is important. Medicines  Take over-the-counter and prescription medicines only as told by your health care provider. Follow directions carefully. Blood pressure medicines must be taken as prescribed.  Do not skip doses of blood pressure medicine. Doing this puts you at risk for problems and can make the medicine less effective.  Ask your health care provider about side effects or reactions to medicines that you should watch for. Contact a health care provider if you:  Think you are having a reaction to a medicine you are taking.  Have headaches that keep coming back (recurring).  Feel dizzy.  Have swelling in your ankles.  Have trouble with your vision. Get help right away if you:  Develop a severe headache or confusion.  Have unusual weakness or numbness.  Feel faint.  Have severe pain in your chest or abdomen.  Vomit repeatedly.  Have trouble  breathing. Summary  Hypertension is when the force of blood pumping through your arteries is too strong. If this condition is not controlled, it may put you at risk for serious complications.  Your personal target blood pressure may vary depending on your medical conditions, your age, and other factors. For most people, a normal blood pressure is less than 120/80.  Hypertension is treated with lifestyle changes, medicines, or a combination of both. Lifestyle changes include losing weight, eating a healthy, low-sodium diet, exercising more, and limiting alcohol. This information is not intended to replace advice given to you by your health care provider. Make sure you discuss any questions you have with your health care provider. Document Revised: 03/12/2018 Document Reviewed: 03/12/2018 Elsevier Patient Education  2020 [employer].

## 2019-07-24 NOTE — Progress Notes (Signed)
Subjective  CC:  Chief Complaint  Patient presents with  . Hypertension  . Ulcerative pancolitis    HPI: Jillian Lopez is a 49 y.o. female who presents to the office today to address the problems listed above in the chief complaint.  Hypertension f/u: here for 3 month f/u after changing from lisinopril 10 to zestoretic 10/12.5 for better bp control. Control is now  fair.. Pt reports she is doing well. taking medications as instructed, no medication side effects noted, no TIAs, no chest pain on exertion, no dyspnea on exertion, no swelling of ankles. She checked her bp at home once or twice and reports 130s/high 80s-90.  She denies adverse effects from his BP medications. Compliance with medication is good.   Reports UC flare that started today w/o pain but BRB in stools. This is her typical. No f/c/s. Appetite is fine.   Assessment  1. Essential hypertension   2. Exacerbation of ulcerative colitis with rectal bleeding ([place])      Plan    Hypertension f/u: BP control is uncertain. She has had atypical elevations in past with normal f/u. At last visit, it was much better. Will check labs since on new med and then have pt check her BP readings at home and send them in to me. Her UC flare may be contributing as she is slightly anxious about this. Last year had colonoscopy which was normal so says this is very unexpected.   UC exacerbation: appears stable. Will contact GI for treatment. Likely will get started on pred. Will check bp after flare has improved and off steroids.   Education regarding management of these chronic disease states was given. Management strategies discussed on successive visits include dietary and exercise recommendations, goals of achieving and maintaining IBW, and lifestyle modifications aiming for adequate sleep and minimizing stressors.   Follow up: Return in about 9 months (around 04/22/2020) for complete physical, follow up Hypertension.  Orders Placed This  Encounter  Procedures  . BMP   No orders of the defined types were placed in this encounter.     BP Readings from Last 3 Encounters:  07/24/19 (!) 156/98  04/17/19 122/88  12/30/18 (!) 152/101   Wt Readings from Last 3 Encounters:  07/24/19 202 lb 6.4 oz (91.8 kg)  04/17/19 200 lb (90.7 kg)  01/07/19 199 lb 15.4 oz (90.7 kg)    Lab Results  Component Value Date   CHOL 197 04/17/2019   CHOL 182 04/11/2018   Lab Results  Component Value Date   HDL 38.80 (L) 04/17/2019   HDL 37.40 (L) 04/11/2018   Lab Results  Component Value Date   LDLCALC 115 (H) 04/11/2018   Lab Results  Component Value Date   TRIG 209.0 (H) 04/17/2019   TRIG 147.0 04/11/2018   Lab Results  Component Value Date   CHOLHDL 5 04/17/2019   CHOLHDL 5 04/11/2018   Lab Results  Component Value Date   LDLDIRECT 134.0 04/17/2019   Lab Results  Component Value Date   CREATININE 0.78 04/17/2019   BUN 13 04/17/2019   NA 138 04/17/2019   K 4.2 04/17/2019   CL 103 04/17/2019   CO2 24 04/17/2019    The 10-year ASCVD risk score Mikey Bussing DC Jr., et al., 2013) is: 3.7%   Values used to calculate the score:     Age: 66 years     Sex: Female     Is Non-Hispanic African American: No     Diabetic:  No     Tobacco smoker: No     Systolic Blood Pressure: 735 mmHg     Is BP treated: Yes     HDL Cholesterol: 38.8 mg/dL     Total Cholesterol: 197 mg/dL  I reviewed the patients updated PMH, FH, and SocHx.    Patient Active Problem List   Diagnosis Date Noted  . Eczema of both external ears 04/11/2018  . Perimenopausal symptom 04/11/2018  . Cervical disc disorder at C5-C6 level with radiculopathy 09/06/2017  . Essential hypertension 03/02/2016  . Ulcerative pancolitis without complication ([place]) 32/99/2426    Allergies: Patient has no known allergies.  Social History: Patient  reports that she quit smoking about 21 years ago. Her smoking use included cigarettes. She has a 1.50 pack-year smoking  history. She has never used smokeless tobacco. She reports current alcohol use of about 4.0 standard drinks of alcohol per week. She reports that she does not use drugs.  Current Meds  Medication Sig  . esomeprazole (NEXIUM) 20 MG capsule Take 20 mg by mouth daily as needed (acid reflux/indigestion.).  Marland Kitchen ibuprofen (ADVIL) 200 MG tablet Take 400 mg by mouth every 6 (six) hours as needed (pain.).  Marland Kitchen lisinopril-hydrochlorothiazide (ZESTORETIC) 10-12.5 MG tablet Take 1 tablet by mouth daily.  Marland Kitchen triamcinolone cream (KENALOG) 0.1 % Apply 1 application topically 2 (two) times daily. As needed (Patient taking differently: Apply 1 application topically 2 (two) times daily as needed (ezcema). As needed)    Review of Systems: Cardiovascular: negative for chest pain, palpitations, leg swelling, orthopnea Respiratory: negative for SOB, wheezing or persistent cough Gastrointestinal: negative for abdominal pain Genitourinary: negative for dysuria or gross hematuria  Objective  Vitals: BP (!) 156/98 (BP Location: Right Arm, Patient Position: Sitting, Cuff Size: Normal)   Pulse 76   Temp 97.9 F (36.6 C) (Temporal)   Ht 5' 7"  (1.702 m)   Wt 202 lb 6.4 oz (91.8 kg)   LMP 07/08/2019 (Exact Date)   SpO2 99%   BMI 31.70 kg/m  General: no acute distress  Cardiovascular:  RRR without murmur. no edema Respiratory:  Good breath sounds bilaterally, CTAB with normal respiratory effort Gastrointestinal: soft, flat abdomen, normal active bowel sounds, no palpable masses, no hepatosplenomegaly, no appreciated hernias     Commons side effects, risks, benefits, and alternatives for medications and treatment plan prescribed today were discussed, and the patient expressed understanding of the given instructions. Patient is instructed to call or message via MyChart if he/she has any questions or concerns regarding our treatment plan. No barriers to understanding were identified. We discussed Red Flag symptoms and  signs in detail. Patient expressed understanding regarding what to do in case of urgent or emergency type symptoms.   Medication list was reconciled, printed and provided to the patient in AVS. Patient instructions and summary information was reviewed with the patient as documented in the AVS. This note was prepared with assistance of Dragon voice recognition software. Occasional wrong-word or sound-a-like substitutions may have occurred due to the inherent limitations of voice recognition software  This visit occurred during the SARS-CoV-2 public health emergency.  Safety protocols were in place, including screening questions prior to the visit, additional usage of staff PPE, and extensive cleaning of exam room while observing appropriate contact time as indicated for disinfecting solutions.

## 2019-07-30 ENCOUNTER — Encounter: Payer: Self-pay | Admitting: Family Medicine

## 2019-08-06 NOTE — Telephone Encounter (Signed)
Blood pressures look good. No med changes at this time.

## 2019-08-07 ENCOUNTER — Other Ambulatory Visit: Payer: Self-pay | Admitting: Family Medicine

## 2019-08-07 MED ORDER — PREDNISONE 10 MG PO TABS: ORAL_TABLET | ORAL | 0 refills | Status: DC

## 2019-08-07 NOTE — Telephone Encounter (Signed)
Having worsening UC flare with mucoid loose stools. No blood or pain. No fevers.  Last flare was 3 years ago.  Last colonoscopy 1 year ago and looked good.  GI is out of the office this week.  Pt is heading out of town.   Start pred taper and she will schedule with GI next week.  Precautions given.

## 2019-08-13 DIAGNOSIS — E669 Obesity, unspecified: Secondary | ICD-10-CM | POA: Diagnosis not present

## 2019-08-13 DIAGNOSIS — K51911 Ulcerative colitis, unspecified with rectal bleeding: Secondary | ICD-10-CM | POA: Diagnosis not present

## 2019-08-19 ENCOUNTER — Encounter: Payer: Self-pay | Admitting: Family Medicine

## 2019-09-22 DIAGNOSIS — L718 Other rosacea: Secondary | ICD-10-CM | POA: Diagnosis not present

## 2019-09-22 DIAGNOSIS — D2271 Melanocytic nevi of right lower limb, including hip: Secondary | ICD-10-CM | POA: Diagnosis not present

## 2019-09-22 DIAGNOSIS — L820 Inflamed seborrheic keratosis: Secondary | ICD-10-CM | POA: Diagnosis not present

## 2019-09-22 DIAGNOSIS — D225 Melanocytic nevi of trunk: Secondary | ICD-10-CM | POA: Diagnosis not present

## 2019-10-21 ENCOUNTER — Other Ambulatory Visit: Payer: Self-pay | Admitting: Family Medicine

## 2019-10-21 MED ORDER — TRIAMCINOLONE ACETONIDE 0.1 % EX CREA: 1.0000 "application " | TOPICAL_CREAM | Freq: Two times a day (BID) | CUTANEOUS | 1 refills | Status: AC | PRN

## 2020-02-26 ENCOUNTER — Encounter: Payer: Self-pay | Admitting: Family Medicine

## 2020-02-26 DIAGNOSIS — M792 Neuralgia and neuritis, unspecified: Secondary | ICD-10-CM | POA: Diagnosis not present

## 2020-02-26 DIAGNOSIS — M205X2 Other deformities of toe(s) (acquired), left foot: Secondary | ICD-10-CM | POA: Diagnosis not present

## 2020-02-26 DIAGNOSIS — M7752 Other enthesopathy of left foot: Secondary | ICD-10-CM | POA: Diagnosis not present

## 2020-02-26 DIAGNOSIS — M24575 Contracture, left foot: Secondary | ICD-10-CM | POA: Diagnosis not present

## 2020-04-08 ENCOUNTER — Other Ambulatory Visit: Payer: Self-pay | Admitting: Family Medicine

## 2020-04-29 ENCOUNTER — Encounter: Payer: BC Managed Care – PPO | Admitting: Family Medicine

## 2020-06-17 DIAGNOSIS — Z124 Encounter for screening for malignant neoplasm of cervix: Secondary | ICD-10-CM | POA: Diagnosis not present

## 2020-06-17 DIAGNOSIS — I10 Essential (primary) hypertension: Secondary | ICD-10-CM | POA: Diagnosis not present

## 2020-06-17 DIAGNOSIS — Z23 Encounter for immunization: Secondary | ICD-10-CM | POA: Diagnosis not present

## 2020-06-17 DIAGNOSIS — K5100 Ulcerative (chronic) pancolitis without complications: Secondary | ICD-10-CM | POA: Diagnosis not present

## 2020-06-17 DIAGNOSIS — Z13220 Encounter for screening for lipoid disorders: Secondary | ICD-10-CM | POA: Diagnosis not present

## 2020-06-17 DIAGNOSIS — Z0000 Encounter for general adult medical examination without abnormal findings: Secondary | ICD-10-CM | POA: Diagnosis not present

## 2020-07-19 DIAGNOSIS — U071 COVID-19: Secondary | ICD-10-CM | POA: Diagnosis not present

## 2020-08-16 ENCOUNTER — Other Ambulatory Visit: Payer: Self-pay | Admitting: Family Medicine

## 2020-08-16 DIAGNOSIS — Z1231 Encounter for screening mammogram for malignant neoplasm of breast: Secondary | ICD-10-CM

## 2020-08-30 DIAGNOSIS — L710 Perioral dermatitis: Secondary | ICD-10-CM | POA: Diagnosis not present

## 2020-08-30 DIAGNOSIS — L218 Other seborrheic dermatitis: Secondary | ICD-10-CM | POA: Diagnosis not present

## 2020-09-13 ENCOUNTER — Ambulatory Visit: Payer: BC Managed Care – PPO

## 2020-10-26 ENCOUNTER — Other Ambulatory Visit: Payer: Self-pay

## 2020-10-26 ENCOUNTER — Ambulatory Visit
Admission: RE | Admit: 2020-10-26 | Discharge: 2020-10-26 | Disposition: A | Discharge status: Home / Self Care | Payer: BC Managed Care – PPO | Source: Ambulatory Visit | Attending: *Deleted | Admitting: *Deleted

## 2020-10-26 DIAGNOSIS — Z1231 Encounter for screening mammogram for malignant neoplasm of breast: Secondary | ICD-10-CM | POA: Diagnosis not present

## 2020-12-22 DIAGNOSIS — E669 Obesity, unspecified: Secondary | ICD-10-CM | POA: Diagnosis not present

## 2020-12-22 DIAGNOSIS — Z1211 Encounter for screening for malignant neoplasm of colon: Secondary | ICD-10-CM | POA: Diagnosis not present

## 2020-12-22 DIAGNOSIS — K5190 Ulcerative colitis, unspecified, without complications: Secondary | ICD-10-CM | POA: Diagnosis not present

## 2021-02-06 DIAGNOSIS — R197 Diarrhea, unspecified: Secondary | ICD-10-CM | POA: Diagnosis not present

## 2021-02-06 DIAGNOSIS — K5190 Ulcerative colitis, unspecified, without complications: Secondary | ICD-10-CM | POA: Diagnosis not present

## 2021-02-06 DIAGNOSIS — K625 Hemorrhage of anus and rectum: Secondary | ICD-10-CM | POA: Diagnosis not present

## 2021-02-07 DIAGNOSIS — K5190 Ulcerative colitis, unspecified, without complications: Secondary | ICD-10-CM | POA: Diagnosis not present

## 2021-02-24 DIAGNOSIS — K922 Gastrointestinal hemorrhage, unspecified: Secondary | ICD-10-CM | POA: Diagnosis not present

## 2021-02-24 DIAGNOSIS — K51011 Ulcerative (chronic) pancolitis with rectal bleeding: Secondary | ICD-10-CM | POA: Diagnosis not present

## 2021-02-24 DIAGNOSIS — K6289 Other specified diseases of anus and rectum: Secondary | ICD-10-CM | POA: Diagnosis not present

## 2021-02-24 DIAGNOSIS — Z1211 Encounter for screening for malignant neoplasm of colon: Secondary | ICD-10-CM | POA: Diagnosis not present

## 2021-02-24 DIAGNOSIS — K51911 Ulcerative colitis, unspecified with rectal bleeding: Secondary | ICD-10-CM | POA: Diagnosis not present

## 2021-02-24 DIAGNOSIS — K529 Noninfective gastroenteritis and colitis, unspecified: Secondary | ICD-10-CM | POA: Diagnosis not present

## 2021-03-02 DIAGNOSIS — R1032 Left lower quadrant pain: Secondary | ICD-10-CM | POA: Diagnosis not present

## 2021-03-02 DIAGNOSIS — K5190 Ulcerative colitis, unspecified, without complications: Secondary | ICD-10-CM | POA: Diagnosis not present

## 2021-03-02 DIAGNOSIS — E669 Obesity, unspecified: Secondary | ICD-10-CM | POA: Diagnosis not present

## 2021-03-02 DIAGNOSIS — K5120 Ulcerative (chronic) proctitis without complications: Secondary | ICD-10-CM | POA: Diagnosis not present

## 2021-04-07 ENCOUNTER — Other Ambulatory Visit: Payer: Self-pay | Admitting: Family Medicine

## 2021-04-20 ENCOUNTER — Other Ambulatory Visit: Payer: Self-pay | Admitting: Family Medicine

## 2021-04-25 DIAGNOSIS — M549 Dorsalgia, unspecified: Secondary | ICD-10-CM | POA: Diagnosis not present

## 2021-04-25 DIAGNOSIS — K5190 Ulcerative colitis, unspecified, without complications: Secondary | ICD-10-CM | POA: Diagnosis not present

## 2021-04-25 DIAGNOSIS — E669 Obesity, unspecified: Secondary | ICD-10-CM | POA: Diagnosis not present

## 2021-04-25 DIAGNOSIS — R350 Frequency of micturition: Secondary | ICD-10-CM | POA: Diagnosis not present

## 2021-04-28 DIAGNOSIS — Z20822 Contact with and (suspected) exposure to covid-19: Secondary | ICD-10-CM | POA: Diagnosis not present

## 2021-04-28 DIAGNOSIS — K529 Noninfective gastroenteritis and colitis, unspecified: Secondary | ICD-10-CM | POA: Diagnosis not present

## 2021-04-28 DIAGNOSIS — A419 Sepsis, unspecified organism: Secondary | ICD-10-CM | POA: Diagnosis not present

## 2021-04-28 DIAGNOSIS — I10 Essential (primary) hypertension: Secondary | ICD-10-CM | POA: Diagnosis not present

## 2021-04-28 DIAGNOSIS — K6389 Other specified diseases of intestine: Secondary | ICD-10-CM | POA: Diagnosis not present

## 2021-04-28 DIAGNOSIS — R918 Other nonspecific abnormal finding of lung field: Secondary | ICD-10-CM | POA: Diagnosis not present

## 2021-04-28 DIAGNOSIS — M549 Dorsalgia, unspecified: Secondary | ICD-10-CM | POA: Diagnosis not present

## 2021-04-28 DIAGNOSIS — R509 Fever, unspecified: Secondary | ICD-10-CM | POA: Diagnosis not present

## 2021-04-28 DIAGNOSIS — Z79899 Other long term (current) drug therapy: Secondary | ICD-10-CM | POA: Diagnosis not present

## 2021-04-28 DIAGNOSIS — R197 Diarrhea, unspecified: Secondary | ICD-10-CM | POA: Diagnosis not present

## 2021-04-28 DIAGNOSIS — K51311 Ulcerative (chronic) rectosigmoiditis with rectal bleeding: Secondary | ICD-10-CM | POA: Diagnosis not present

## 2021-04-28 DIAGNOSIS — M5124 Other intervertebral disc displacement, thoracic region: Secondary | ICD-10-CM | POA: Diagnosis not present

## 2021-04-28 DIAGNOSIS — R1011 Right upper quadrant pain: Secondary | ICD-10-CM | POA: Diagnosis not present

## 2021-05-25 DIAGNOSIS — K51911 Ulcerative colitis, unspecified with rectal bleeding: Secondary | ICD-10-CM | POA: Diagnosis not present

## 2021-05-30 DIAGNOSIS — K5190 Ulcerative colitis, unspecified, without complications: Secondary | ICD-10-CM | POA: Diagnosis not present

## 2021-06-11 DIAGNOSIS — J019 Acute sinusitis, unspecified: Secondary | ICD-10-CM | POA: Diagnosis not present

## 2021-06-11 DIAGNOSIS — J209 Acute bronchitis, unspecified: Secondary | ICD-10-CM | POA: Diagnosis not present

## 2021-06-20 DIAGNOSIS — Z23 Encounter for immunization: Secondary | ICD-10-CM | POA: Diagnosis not present

## 2021-06-20 DIAGNOSIS — I10 Essential (primary) hypertension: Secondary | ICD-10-CM | POA: Diagnosis not present

## 2021-06-20 DIAGNOSIS — K5100 Ulcerative (chronic) pancolitis without complications: Secondary | ICD-10-CM | POA: Diagnosis not present

## 2021-06-20 DIAGNOSIS — R911 Solitary pulmonary nodule: Secondary | ICD-10-CM | POA: Diagnosis not present

## 2021-06-20 DIAGNOSIS — D84821 Immunodeficiency due to drugs: Secondary | ICD-10-CM | POA: Diagnosis not present

## 2021-06-20 DIAGNOSIS — Z0000 Encounter for general adult medical examination without abnormal findings: Secondary | ICD-10-CM | POA: Diagnosis not present

## 2021-06-20 DIAGNOSIS — Z13220 Encounter for screening for lipoid disorders: Secondary | ICD-10-CM | POA: Diagnosis not present

## 2021-06-27 DIAGNOSIS — R911 Solitary pulmonary nodule: Secondary | ICD-10-CM | POA: Diagnosis not present

## 2021-08-31 ENCOUNTER — Other Ambulatory Visit: Payer: Self-pay | Admitting: Gastroenterology

## 2021-08-31 ENCOUNTER — Ambulatory Visit (INDEPENDENT_AMBULATORY_CARE_PROVIDER_SITE_OTHER): Payer: BC Managed Care – PPO

## 2021-08-31 ENCOUNTER — Other Ambulatory Visit: Payer: Self-pay

## 2021-08-31 DIAGNOSIS — R1032 Left lower quadrant pain: Secondary | ICD-10-CM

## 2021-08-31 LAB — I-STAT CREATININE (MANUAL ENTRY): Creatinine, Ser: 0.9 (ref 0.50–1.10)

## 2021-08-31 MED ORDER — IOHEXOL 9 MG/ML PO SOLN: 500.0000 mL | ORAL | Status: AC

## 2021-08-31 MED ORDER — IOHEXOL 300 MG/ML  SOLN
100.0000 mL | Freq: Once | INTRAMUSCULAR | Status: AC | PRN
  Administered 2021-08-31: 100 mL via INTRAVENOUS

## 2022-04-09 ENCOUNTER — Encounter: Payer: Self-pay | Admitting: *Deleted

## 2022-04-17 ENCOUNTER — Telehealth: Payer: Self-pay | Admitting: *Deleted

## 2022-04-17 NOTE — Telephone Encounter (Signed)
Returned call from 2:10 PM. Patient wants to reschedule appointment on 04/18/2022 due to illness. Left patient a message to call the office to reschedule.

## 2022-04-18 ENCOUNTER — Encounter: Payer: BC Managed Care – PPO | Admitting: Obstetrics & Gynecology

## 2022-05-28 ENCOUNTER — Encounter: Payer: Self-pay | Admitting: Obstetrics & Gynecology

## 2022-05-28 ENCOUNTER — Ambulatory Visit: Payer: BC Managed Care – PPO | Admitting: Obstetrics & Gynecology

## 2022-05-28 VITALS — BP 146/86 | HR 71 | Resp 16 | Ht 66.0 in | Wt 196.0 lb

## 2022-05-28 DIAGNOSIS — Z8041 Family history of malignant neoplasm of ovary: Secondary | ICD-10-CM

## 2022-05-28 DIAGNOSIS — N950 Postmenopausal bleeding: Secondary | ICD-10-CM

## 2022-05-28 DIAGNOSIS — K6289 Other specified diseases of anus and rectum: Secondary | ICD-10-CM | POA: Insufficient documentation

## 2022-05-28 DIAGNOSIS — E669 Obesity, unspecified: Secondary | ICD-10-CM | POA: Insufficient documentation

## 2022-05-28 DIAGNOSIS — R1031 Right lower quadrant pain: Secondary | ICD-10-CM | POA: Diagnosis not present

## 2022-05-28 DIAGNOSIS — K5900 Constipation, unspecified: Secondary | ICD-10-CM | POA: Insufficient documentation

## 2022-05-28 NOTE — Progress Notes (Signed)
Last pap 2021 @ [facility]

## 2022-05-28 NOTE — Progress Notes (Addendum)
   Subjective:    Patient ID: Jillian Lopez, female    DOB: Apr 29, 1971, 51 y.o.   MRN: [id number]  HPI  51 yo G0P0 female presents for 1 episode of PMB>  Pt is over a year without a menstrual cycle.  She 5 days of bleeding in July.  She did have symptoms of ovulation prior to the bleed.  She also has c/o of RLQ pain for several days a month.  Patient denies dyspareunia, vaginal discharge, spotting, changes in urinary symptoms.  Patient has ulcerative colitis so does have a fair amount of bowel symptoms.  Her colonoscopy are up-to-date.  Review of Systems  Constitutional: Negative.   Respiratory: Negative.    Cardiovascular: Negative.   Gastrointestinal: Negative.   Genitourinary: Negative.        Objective:   Physical Exam Vitals reviewed.  Constitutional:      General: She is not in acute distress.    Appearance: She is well-developed.  HENT:     Head: Normocephalic and atraumatic.  Eyes:     Conjunctiva/sclera: Conjunctivae normal.  Cardiovascular:     Rate and Rhythm: Normal rate.  Pulmonary:     Effort: Pulmonary effort is normal.  Genitourinary:    Comments: Tanner V Vulva:  No lesion Vagina:  Pink, no lesions, no discharge, no blood Cervix:  No CMT Uterus:  Non tender, mobile Right adnexa--non tender, no mass Left adnexa--non tender, no mass Skin:    General: Skin is warm and dry.  Neurological:     Mental Status: She is alert and oriented to person, place, and time.  Psychiatric:        Mood and Affect: Mood normal.    Vitals:   05/28/22 0811  BP: (!) 146/86  Pulse: 71  Resp: 16  Weight: 196 lb (88.9 kg)  Height: 5' 6"  (1.676 m)       Assessment & Plan:  51 year old female who presents for postmenopausal bleeding.  Patient also has right lower quadrant pain for several months.  It is vaginal Pelvis ultrasound complete with transvaginal.  We were able to assess her endometrial lining as well as her ovaries.  If the lining is greater than than or equal  to 5 mm then we will proceed with endometrial biopsy. Patient has a history of 2 aunts with ovarian cancer.  She would like genetic testing and this was ordered today. Elevated BP==pt nervous today.  She has PCP who manages her HTN.

## 2022-05-30 ENCOUNTER — Ambulatory Visit (INDEPENDENT_AMBULATORY_CARE_PROVIDER_SITE_OTHER): Payer: BC Managed Care – PPO

## 2022-05-30 DIAGNOSIS — N950 Postmenopausal bleeding: Secondary | ICD-10-CM | POA: Diagnosis not present

## 2022-05-31 ENCOUNTER — Other Ambulatory Visit: Payer: BC Managed Care – PPO

## 2022-06-01 ENCOUNTER — Other Ambulatory Visit: Payer: BC Managed Care – PPO

## 2022-06-18 LAB — EMPOWER BRCA (2): REPORT SUMMARY: NEGATIVE

## 2022-08-08 ENCOUNTER — Other Ambulatory Visit: Payer: Self-pay | Admitting: Family Medicine

## 2022-08-08 DIAGNOSIS — Z1231 Encounter for screening mammogram for malignant neoplasm of breast: Secondary | ICD-10-CM

## 2022-08-13 ENCOUNTER — Ambulatory Visit
Admission: RE | Admit: 2022-08-13 | Discharge: 2022-08-13 | Disposition: A | Discharge status: Home / Self Care | Payer: BC Managed Care – PPO | Source: Ambulatory Visit | Attending: *Deleted | Admitting: *Deleted

## 2022-08-13 DIAGNOSIS — Z1231 Encounter for screening mammogram for malignant neoplasm of breast: Secondary | ICD-10-CM

## 2022-10-30 IMAGING — MG MM DIGITAL SCREENING BILAT W/ TOMO AND CAD
8 series · 9 of 24 positions shown · non-contrast
Comparison: Previous exam(s).

CLINICAL DATA: Screening.

EXAM:
DIGITAL SCREENING BILATERAL MAMMOGRAM WITH TOMOSYNTHESIS AND CAD
TECHNIQUE: Bilateral screening digital craniocaudal and mediolateral oblique
mammograms were obtained. Bilateral screening digital breast
tomosynthesis was performed. The images were evaluated with
computer-aided detection.

[R CC synth-2D]
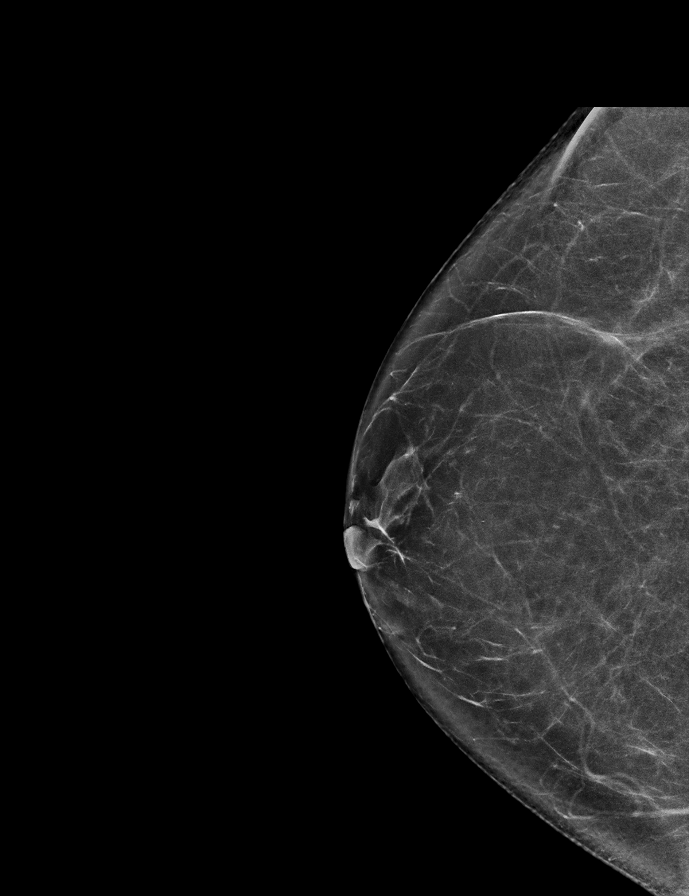

[R MLO synth-2D]
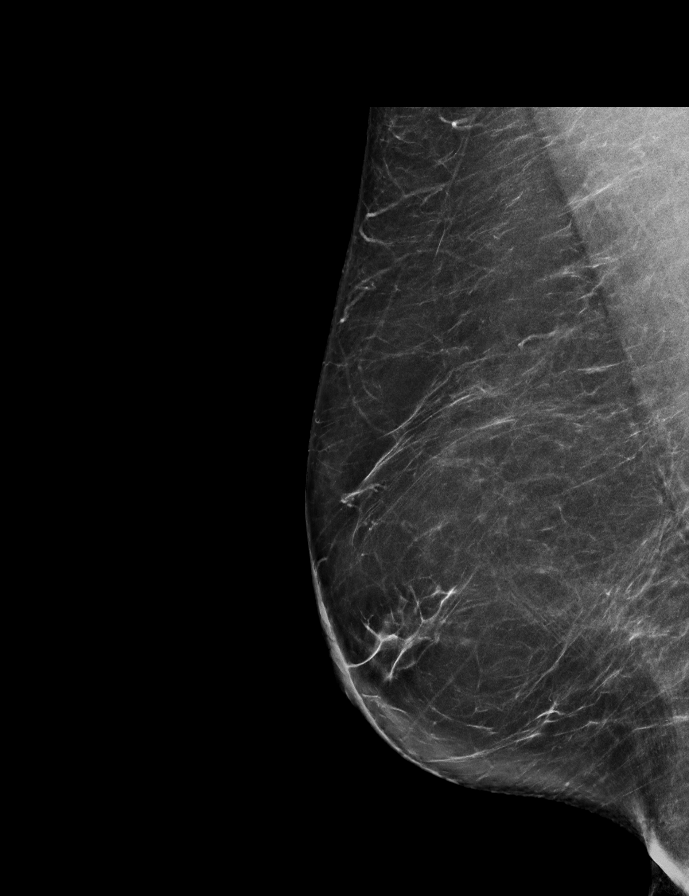

[L CC synth-2D]
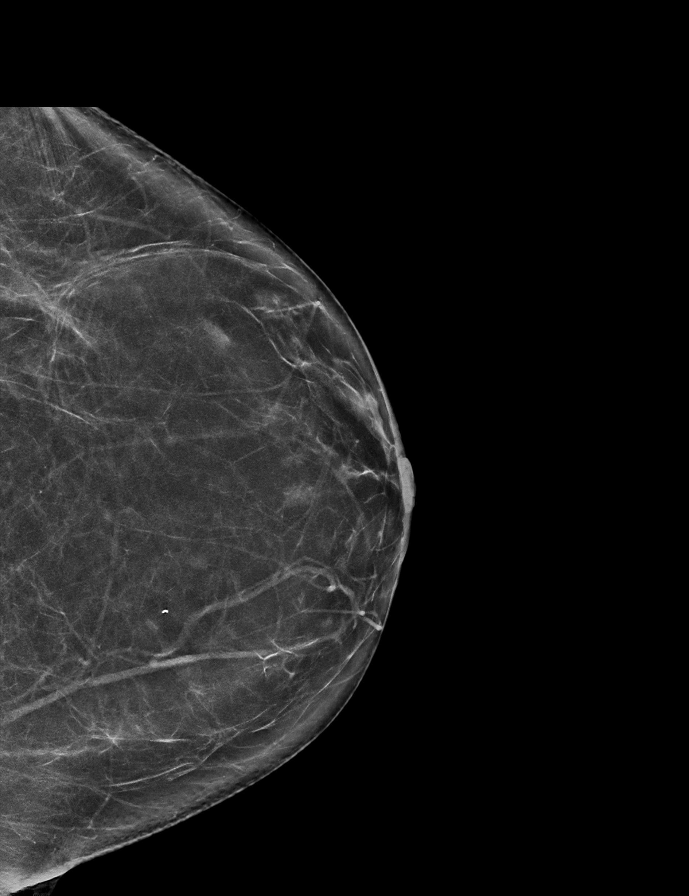

[L MLO synth-2D]
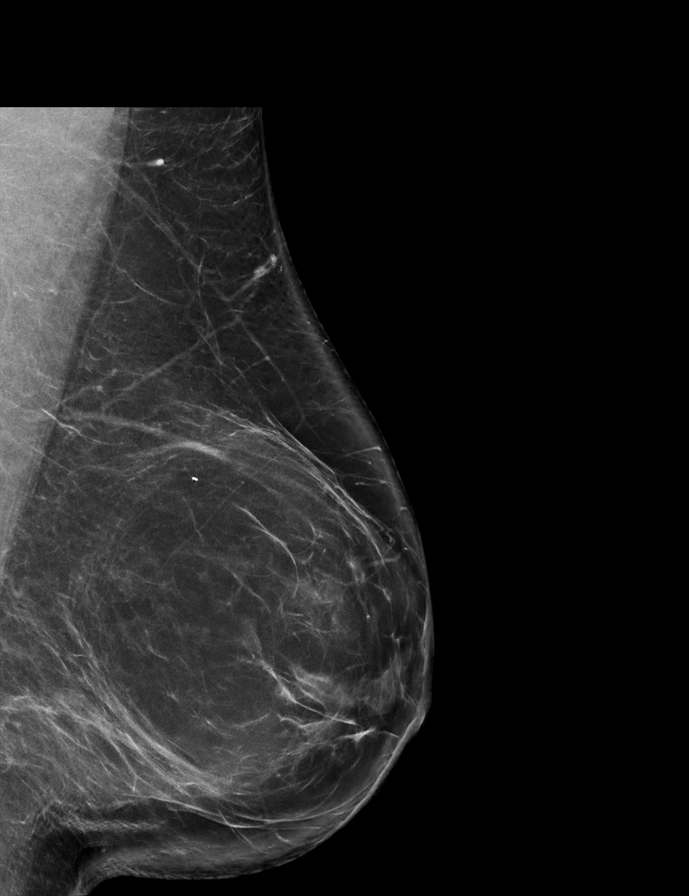

[R MLO tomo · 2 of 76 frames shown]
[frame 25/76]
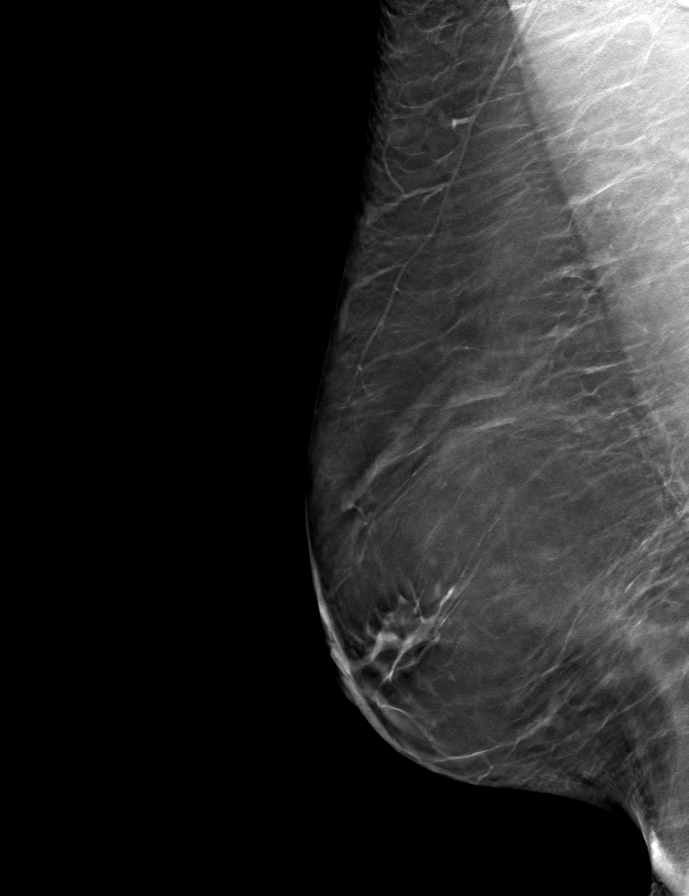
[frame 39/76]
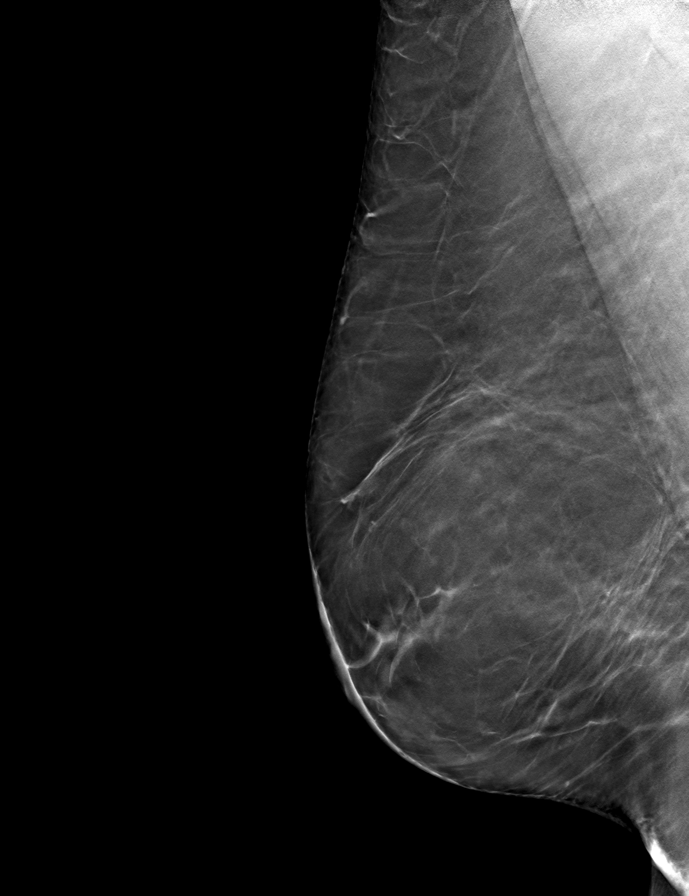

[L MLO tomo · tomo slice 40/79.0]
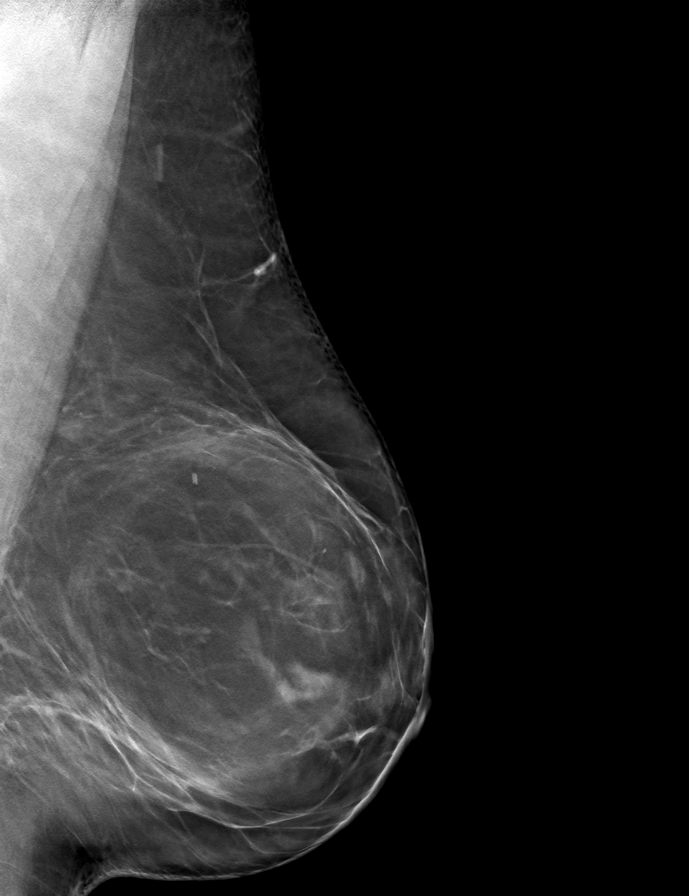

[L CC tomo · tomo slice 35/70.0]
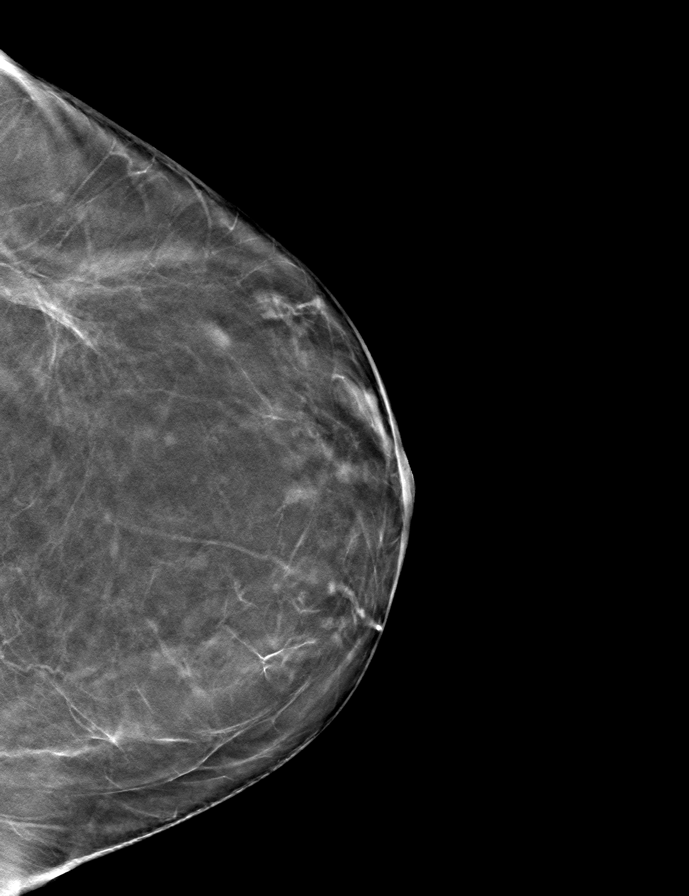

[R CC tomo · tomo slice 36/71.0]
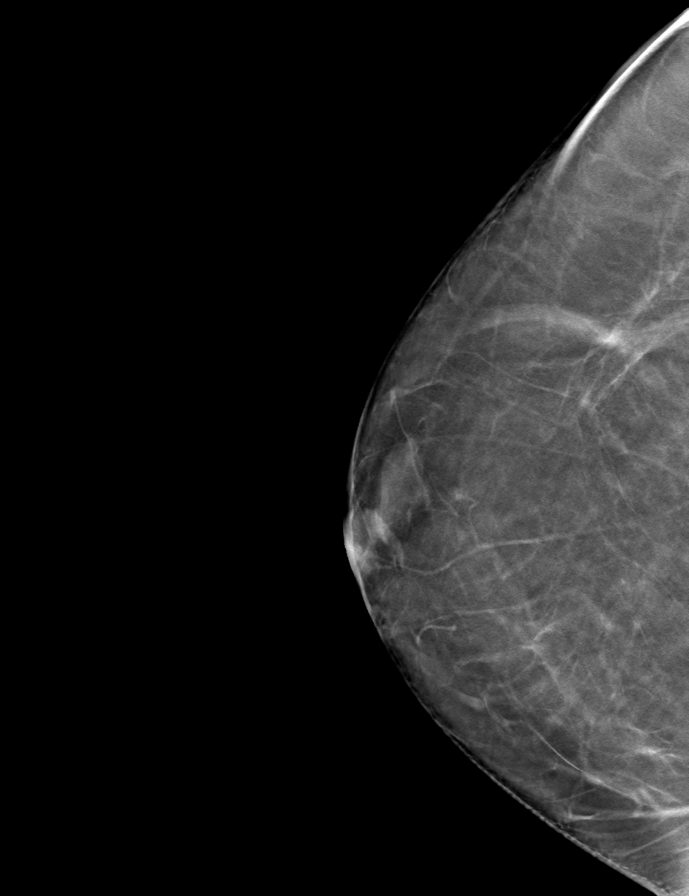

[9 of 24 positions shown; findings below may reference images not displayed]

ACR Breast Density Category b: There are scattered areas of
fibroglandular density.
FINDINGS: There are no findings suspicious for malignancy. The images were
evaluated with computer-aided detection.
IMPRESSION: No mammographic evidence of malignancy. A result letter of this
screening mammogram will be mailed directly to the patient.

RECOMMENDATION:
Screening mammogram in one year. (Code:WJ-I-BG6)

BI-RADS CATEGORY  1: Negative.

## 2023-01-29 ENCOUNTER — Ambulatory Visit
Admission: RE | Admit: 2023-01-29 | Discharge: 2023-01-29 | Disposition: A | Discharge status: Home / Self Care | Payer: BC Managed Care – PPO | Source: Ambulatory Visit | Attending: Family Medicine | Admitting: Family Medicine

## 2023-01-29 ENCOUNTER — Other Ambulatory Visit: Payer: Self-pay | Admitting: Family Medicine

## 2023-01-29 DIAGNOSIS — M25551 Pain in right hip: Secondary | ICD-10-CM

## 2023-01-30 ENCOUNTER — Other Ambulatory Visit: Payer: Self-pay | Admitting: Family Medicine

## 2023-01-30 ENCOUNTER — Ambulatory Visit
Admission: RE | Admit: 2023-01-30 | Discharge: 2023-01-30 | Disposition: A | Discharge status: Home / Self Care | Payer: BC Managed Care – PPO | Source: Ambulatory Visit | Attending: Family Medicine | Admitting: Family Medicine

## 2023-01-30 DIAGNOSIS — M545 Low back pain, unspecified: Secondary | ICD-10-CM

## 2023-01-30 DIAGNOSIS — G8929 Other chronic pain: Secondary | ICD-10-CM

## 2023-02-19 ENCOUNTER — Encounter: Payer: Self-pay | Admitting: Family Medicine

## 2023-02-20 ENCOUNTER — Ambulatory Visit
Admission: RE | Admit: 2023-02-20 | Discharge: 2023-02-20 | Disposition: A | Discharge status: Home / Self Care | Payer: BC Managed Care – PPO | Source: Ambulatory Visit | Attending: Family Medicine | Admitting: Family Medicine

## 2023-02-20 DIAGNOSIS — G8929 Other chronic pain: Secondary | ICD-10-CM

## 2023-02-20 DIAGNOSIS — M545 Low back pain, unspecified: Secondary | ICD-10-CM

## 2023-09-04 IMAGING — CT CT ABD-PELV W/ CM
2 of 5 series · 15 of 46 positions shown, 17 images · IV contrast (APPLIED)
Comparison: Ultrasound June 02, 2014.

CLINICAL DATA: 24 hours of abdominal weight pain worse on the left.
History of ulcerative colitis.

EXAM:
CT ABDOMEN AND PELVIS WITH CONTRAST
TECHNIQUE: Multidetector CT imaging of the abdomen and pelvis was performed
using the standard protocol following bolus administration of
intravenous contrast.

[Series 2: axial st · axial · 0.69mm/px · z∈[-440,-15]mm · 12 of 96 slices shown, 14 images]
[im 6/96  soft-tissue]
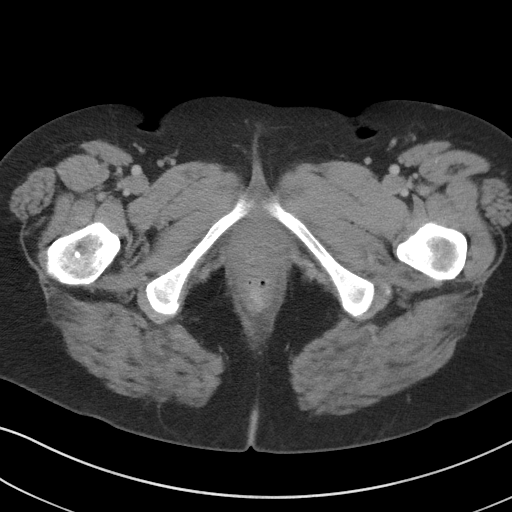
[im 6/96  bone]
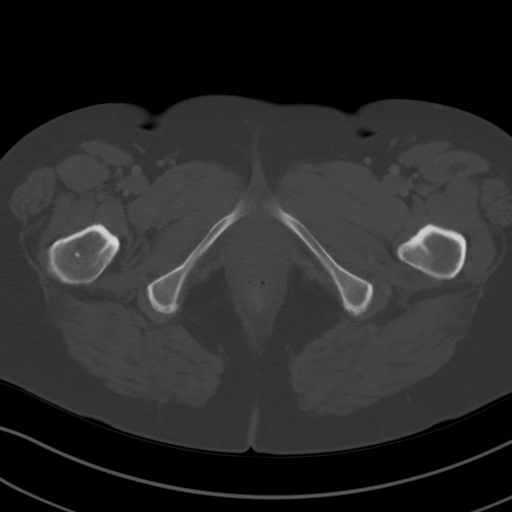
[im 16/96  soft-tissue]
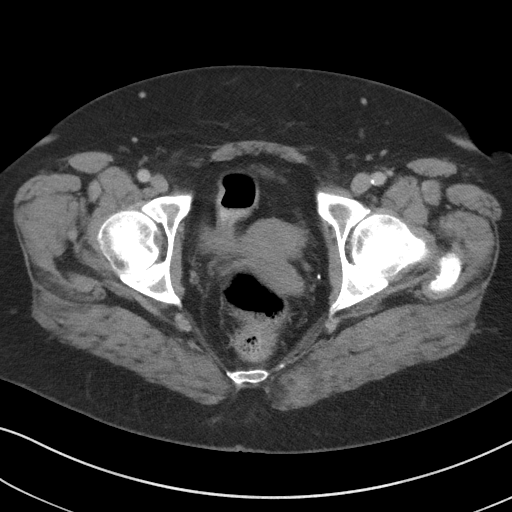
[im 21/96  soft-tissue]
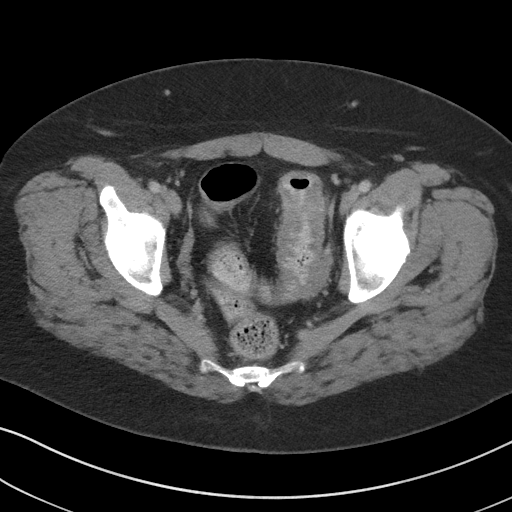
[im 31/96  soft-tissue]
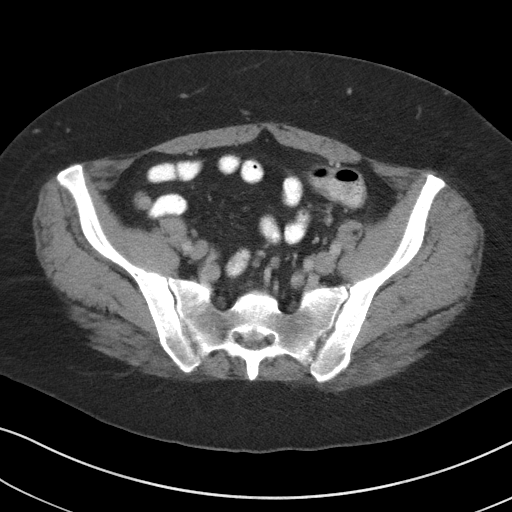
[im 36/96  soft-tissue]
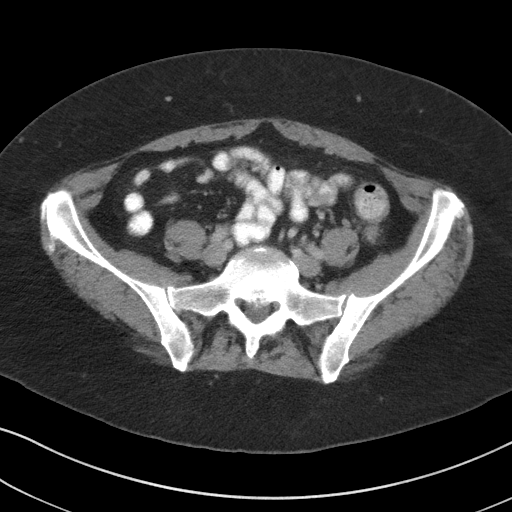
[im 46/96  soft-tissue]
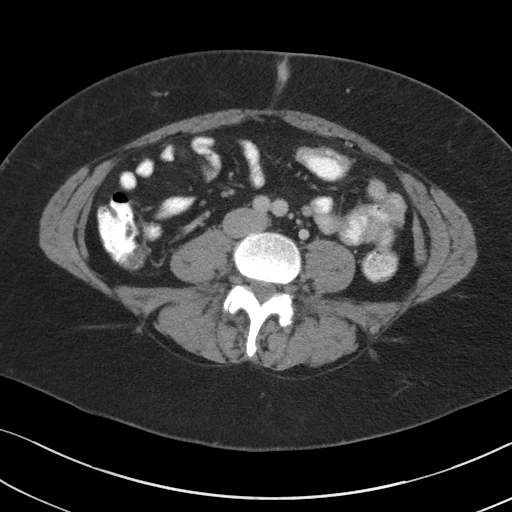
[im 51/96  soft-tissue]
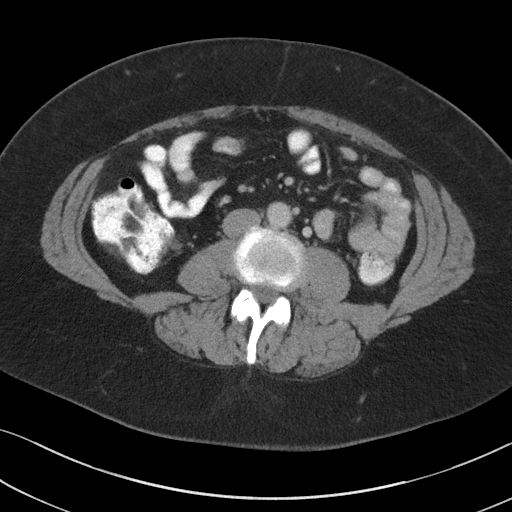
[im 61/96  soft-tissue]
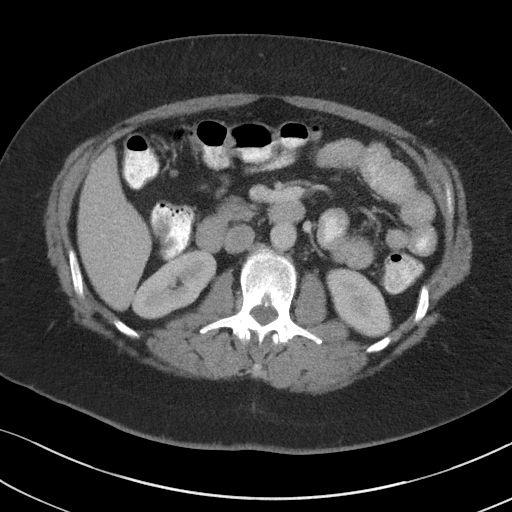
[im 66/96  soft-tissue]
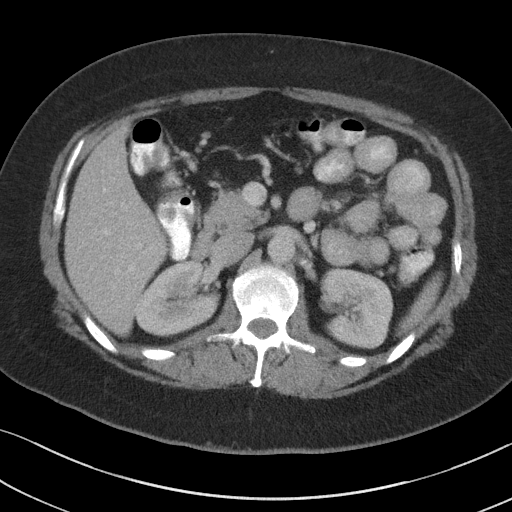
[im 66/96  bone]
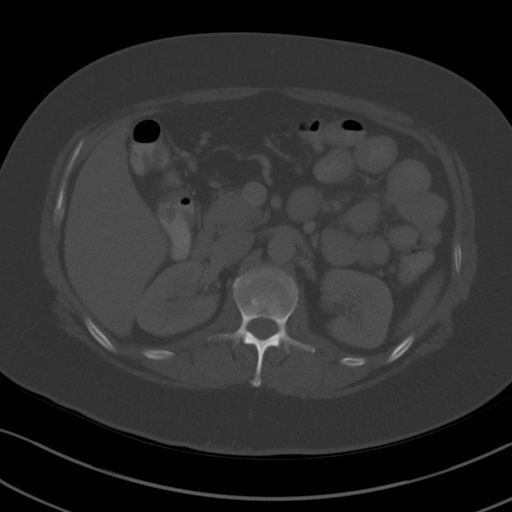
[im 76/96  soft-tissue]
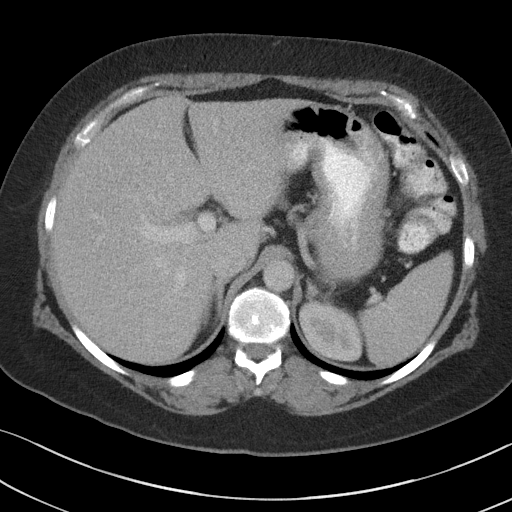
[im 81/96  soft-tissue]
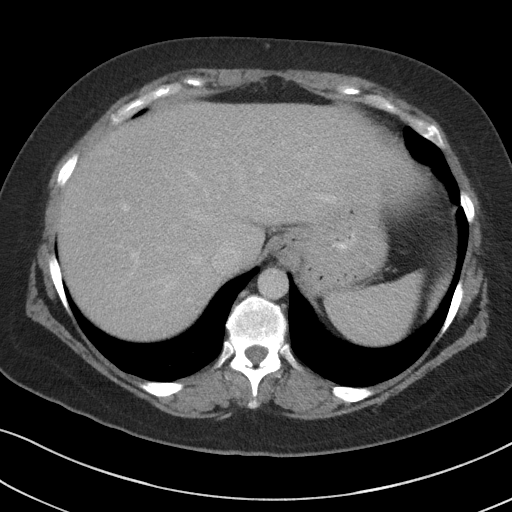
[im 91/96  soft-tissue]
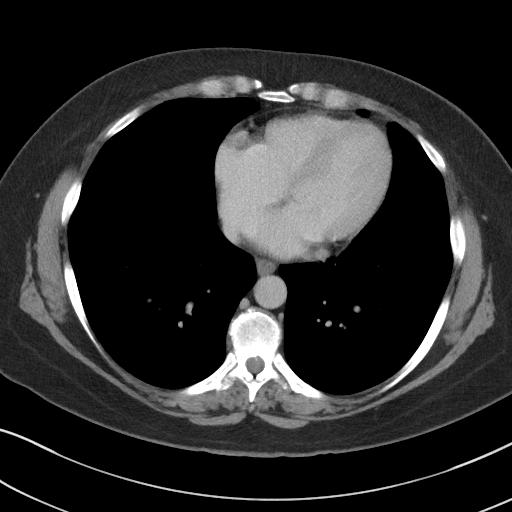

[Series 5: coronal st · coronal · 0.67mm/px · 3 of 81 slices shown]
[im 27/81  soft-tissue]
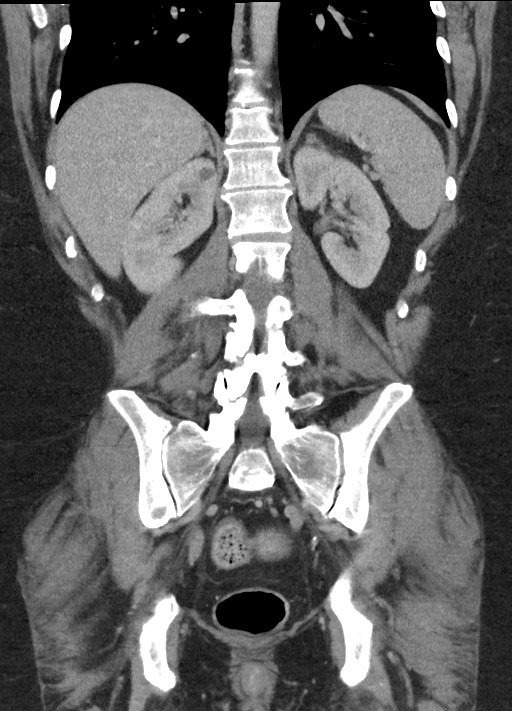
[im 36/81  soft-tissue]
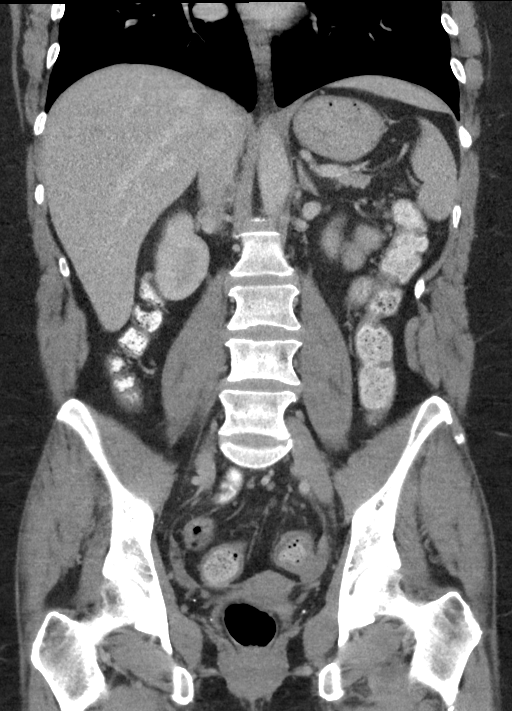
[im 45/81  soft-tissue]
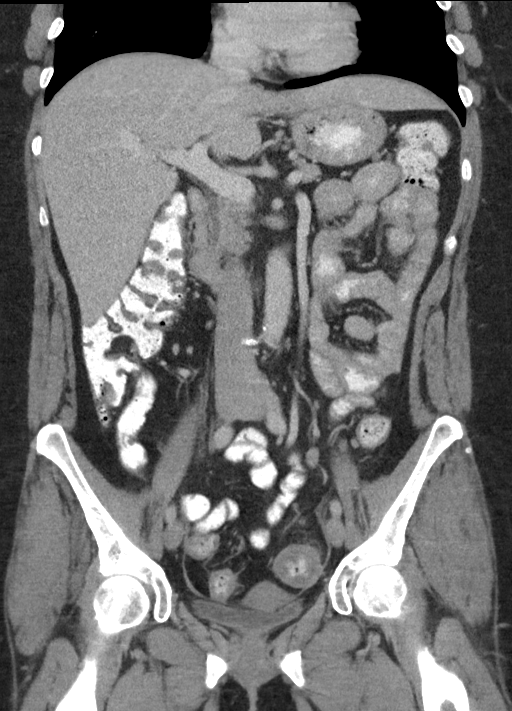

[15 of 46 positions shown; findings below may reference images not displayed]

RADIATION DOSE REDUCTION: This exam was performed according to the
departmental dose-optimization program which includes automated
exposure control, adjustment of the mA and/or kV according to
patient size and/or use of iterative reconstruction technique.

CONTRAST:  100mL OMNIPAQUE IOHEXOL 300 MG/ML  SOLN
FINDINGS: Lower chest: No acute abnormality.

Hepatobiliary: No suspicious hepatic lesion. Gallbladder is
unremarkable. No biliary ductal dilation.

Pancreas: No pancreatic ductal dilation or evidence of acute
inflammation.

Spleen: Normal in size without focal abnormality.

Adrenals/Urinary Tract: Bilateral adrenal glands appear normal. No
hydronephrosis. Hypodense subcentimeter right upper pole renal
lesion is technically too small to accurately characterize but
statistically likely to reflect a cyst. No solid enhancing renal
mass. Symmetric enhancement and excretion of contrast material from
the bilateral kidneys. Urinary bladder is nondistended limiting
evaluation.

Stomach/Bowel: Radiopaque enteric contrast material traverses the
rectum. Stomach is unremarkable for degree of distension. No
pathologic dilation of small or large bowel. The appendix and
terminal ileum appear normal. There are few scattered sigmoid
colonic diverticula with a short segment of sigmoid colonic wall
thickening and adjacent inflammation measuring approximately 7.7 cm
in length on axial image 76/2.

Vascular/Lymphatic: Scattered aortic atherosclerosis without
aneurysmal dilation. No pathologic dilation of small or large bowel.

Reproductive: Uterus and bilateral adnexa are unremarkable.

Other: No significant abdominopelvic free fluid.

Musculoskeletal: Mild thoracolumbar spondylosis. No acute osseous
abnormality.
IMPRESSION: 1. Acute sigmoid diverticulitis/colitis without evidence of
perforation.

2.Aortic Atherosclerosis ([id number].P).
